# Patient Record
Sex: Male | Born: 1950 | Race: Black or African American | Hispanic: No | Marital: Married | Smoking: Never smoker
Health system: Southern US, Community
[De-identification: ages and names within clinical notes are randomized; demographics above are authoritative.]

## PROBLEM LIST (undated history)

## (undated) DIAGNOSIS — I10 Essential (primary) hypertension: Secondary | ICD-10-CM

## (undated) DIAGNOSIS — E119 Type 2 diabetes mellitus without complications: Secondary | ICD-10-CM

## (undated) DIAGNOSIS — E785 Hyperlipidemia, unspecified: Secondary | ICD-10-CM

## (undated) DIAGNOSIS — C801 Malignant (primary) neoplasm, unspecified: Secondary | ICD-10-CM

## (undated) HISTORY — DX: Essential (primary) hypertension: I10

## (undated) HISTORY — DX: Hyperlipidemia, unspecified: E78.5

## (undated) HISTORY — DX: Malignant (primary) neoplasm, unspecified: C80.1

## (undated) HISTORY — DX: Type 2 diabetes mellitus without complications: E11.9

---

## 1996-05-25 DIAGNOSIS — E119 Type 2 diabetes mellitus without complications: Secondary | ICD-10-CM

## 1996-05-25 HISTORY — DX: Type 2 diabetes mellitus without complications: E11.9

## 2004-04-29 ENCOUNTER — Inpatient Hospital Stay (HOSPITAL_COMMUNITY): Admission: EM | Admit: 2004-04-29 | Discharge: 2004-05-01 | Payer: Self-pay | Admitting: Emergency Medicine

## 2004-05-21 ENCOUNTER — Encounter: Admission: RE | Admit: 2004-05-21 | Discharge: 2004-08-19 | Payer: Self-pay | Admitting: Internal Medicine

## 2004-07-07 ENCOUNTER — Encounter: Admission: RE | Admit: 2004-07-07 | Discharge: 2004-07-07 | Payer: Self-pay | Admitting: Orthopedic Surgery

## 2006-03-31 ENCOUNTER — Inpatient Hospital Stay (HOSPITAL_COMMUNITY): Admission: RE | Admit: 2006-03-31 | Discharge: 2006-04-01 | Payer: Self-pay | Admitting: Urology

## 2006-04-14 ENCOUNTER — Emergency Department (HOSPITAL_COMMUNITY): Admission: EM | Admit: 2006-04-14 | Discharge: 2006-04-14 | Payer: Self-pay | Admitting: Emergency Medicine

## 2006-05-25 DIAGNOSIS — C801 Malignant (primary) neoplasm, unspecified: Secondary | ICD-10-CM

## 2006-05-25 HISTORY — DX: Malignant (primary) neoplasm, unspecified: C80.1

## 2006-05-25 HISTORY — PX: PROSTATE SURGERY: SHX751

## 2006-10-15 IMAGING — CR DG CHEST 1V PORT
1 series · 1 of 1 positions shown · non-contrast
Comparison: none

CLINICAL DATA: Left-sided chest pain.  Tingling into the legs.
 PORTABLE CHEST - 04/29/04 AT 0902 HOURS:
 No comparisons.   
 Allowing for the portable technique and positioning the heart size is believed to be normal.  The lungs are clear and no pleural fluid is seen.

[view not recorded]
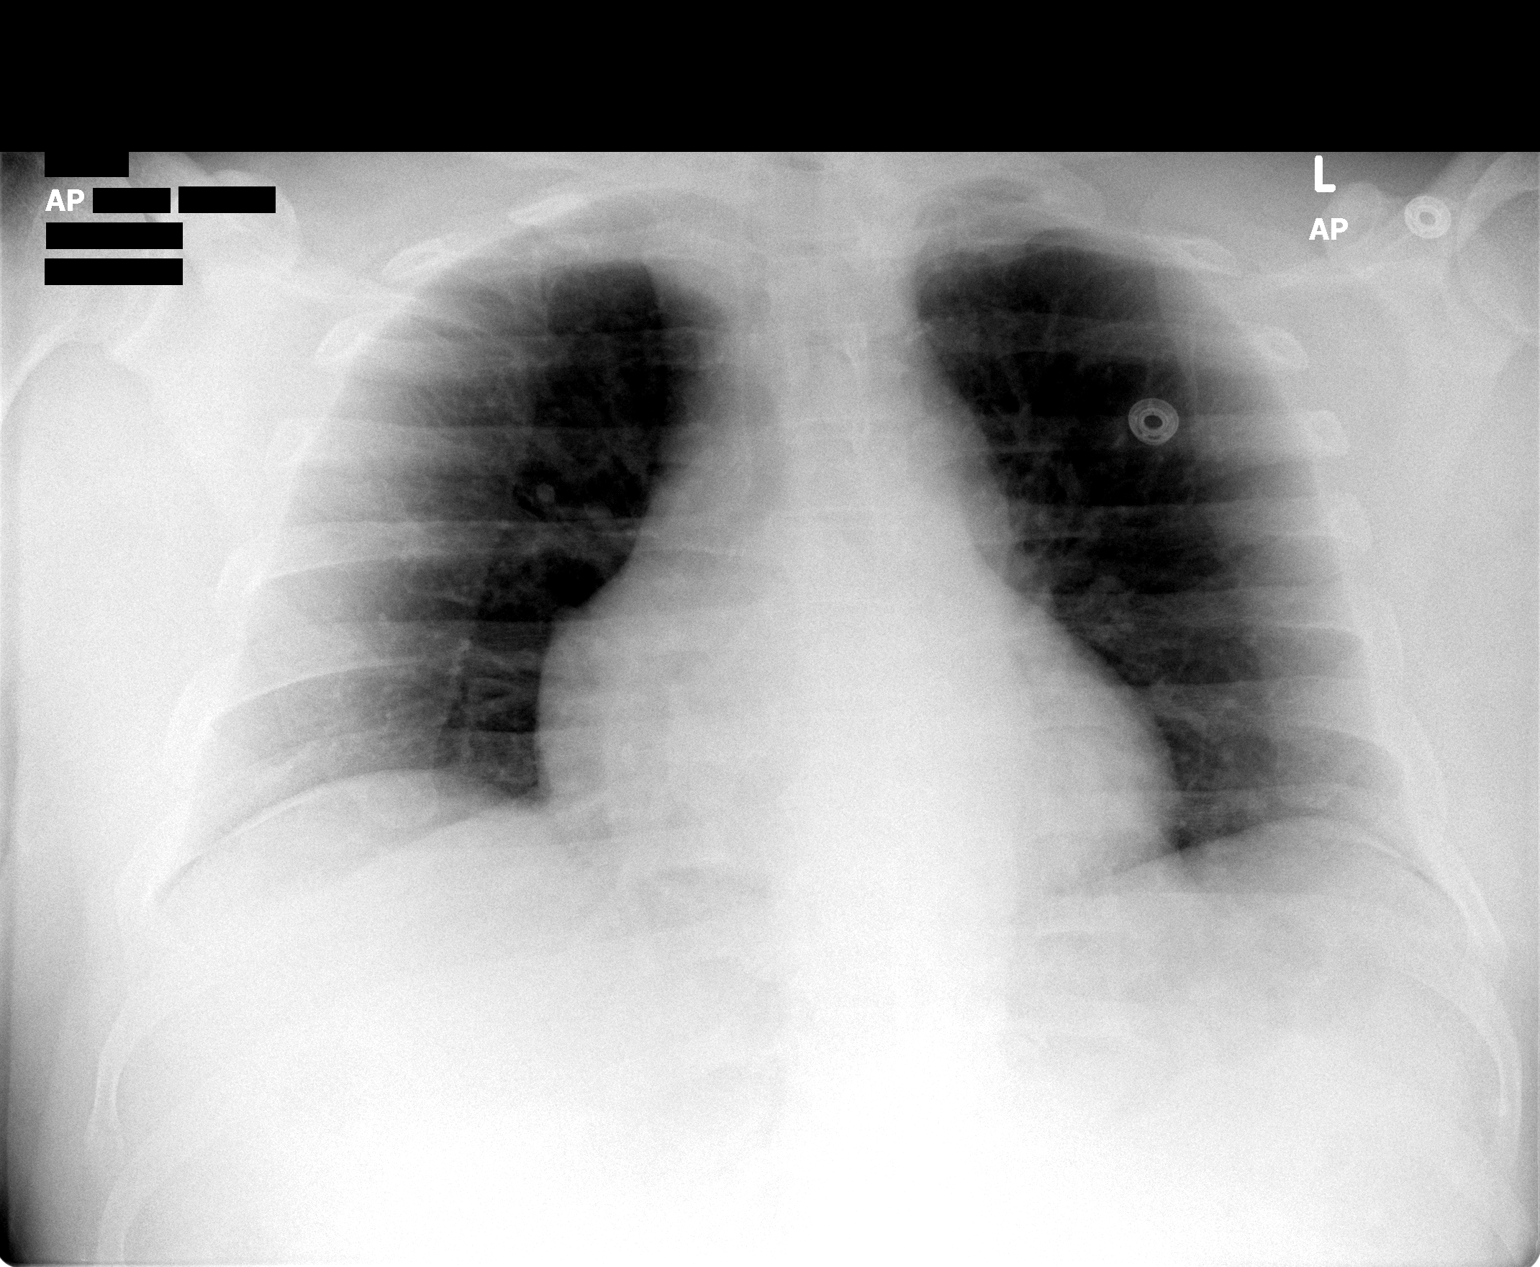

[1 of 1 positions shown; findings below may reference images not displayed]

IMPRESSION: No acute finding is seen.

## 2006-12-23 IMAGING — CT CT HEAD W/O CM
1 series · 16 of 28 positions shown, 20 images · IV contrast (agent unspecified)
Comparison: none

CLINICAL DATA: Head injury with laceration.  Contusion to head. 
 CT HEAD WITHOUT CONTRAST:
TECHNIQUE: Multidetector helical CT scanning obtained from the skull base to the vertex.

[Series 2: brain · axial · 0.49mm/px · z∈[+20,+149]mm · 16 of 28 slices shown, 20 images]
[im 2/28  brain]
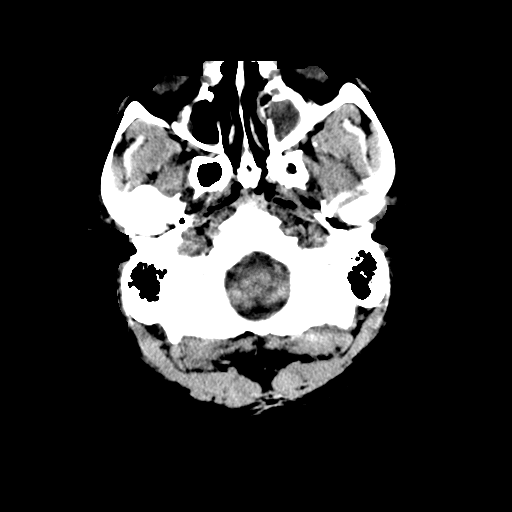
[im 2/28  bone]
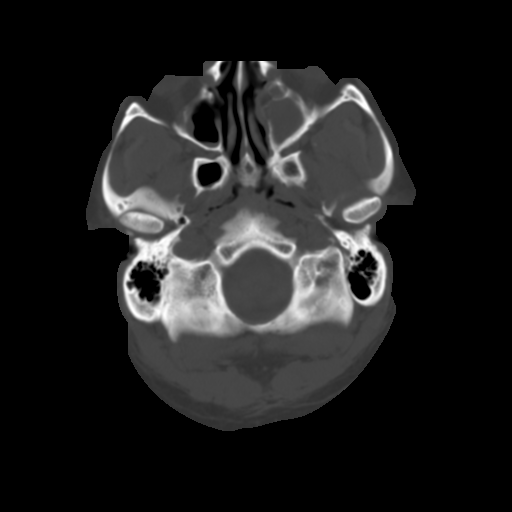
[im 4/28  brain]
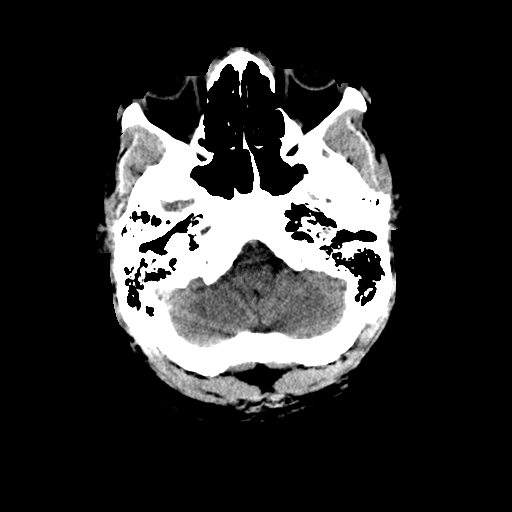
[im 6/28  brain]
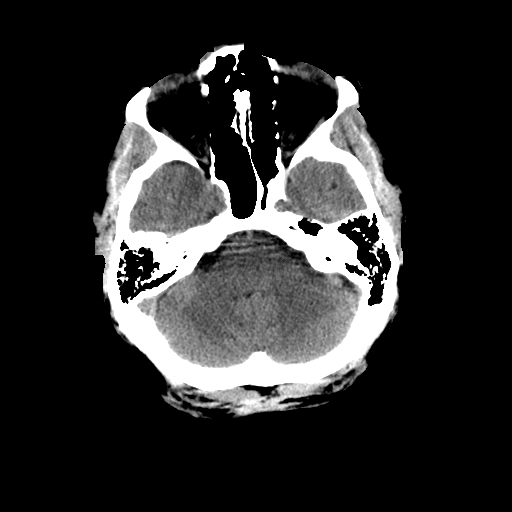
[im 7/28  brain]
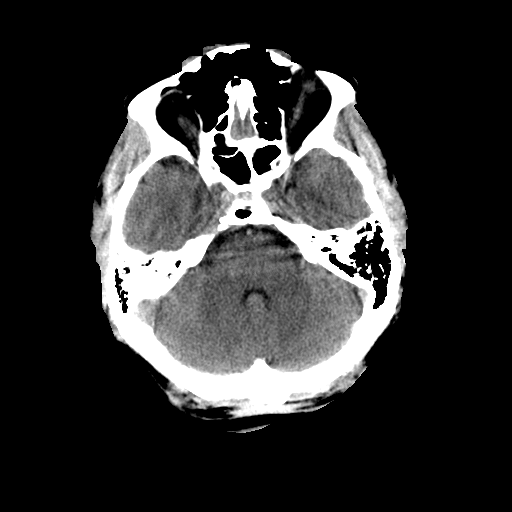
[im 9/28  brain]
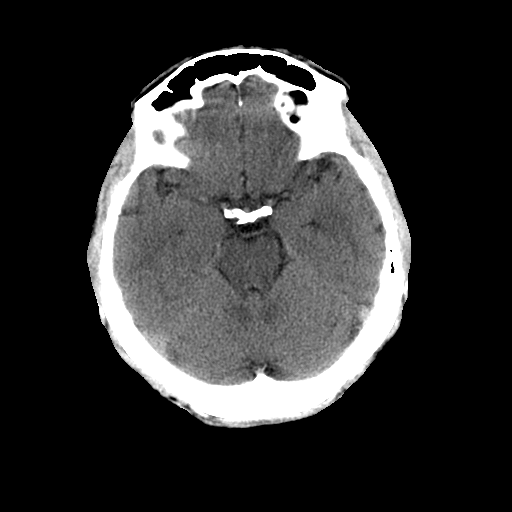
[im 9/28  bone]
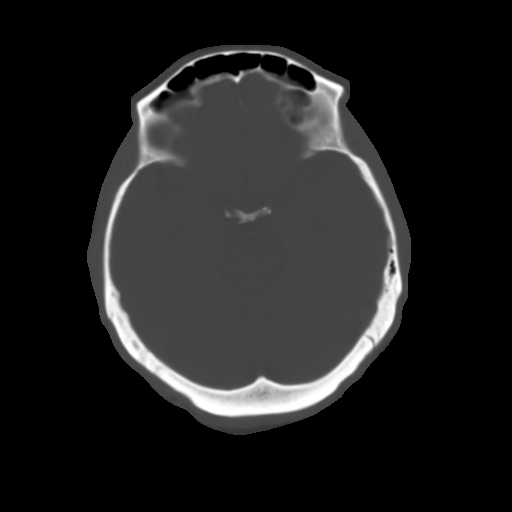
[im 10/28  brain]
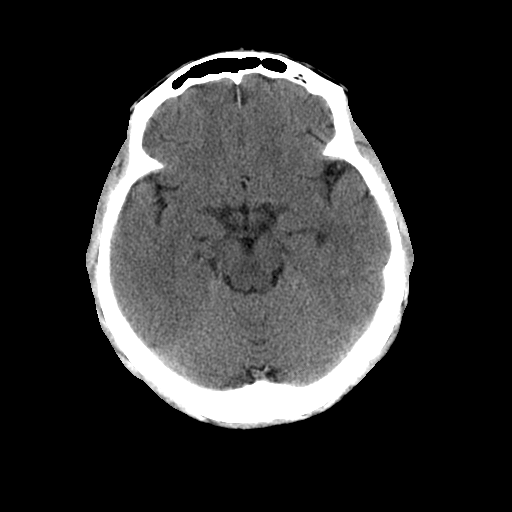
[im 12/28  brain]
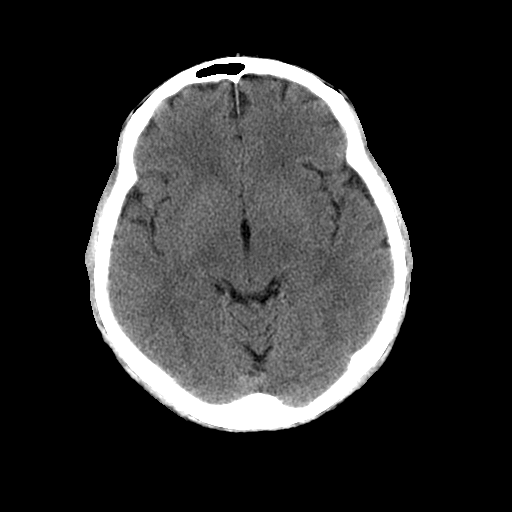
[im 14/28  brain]
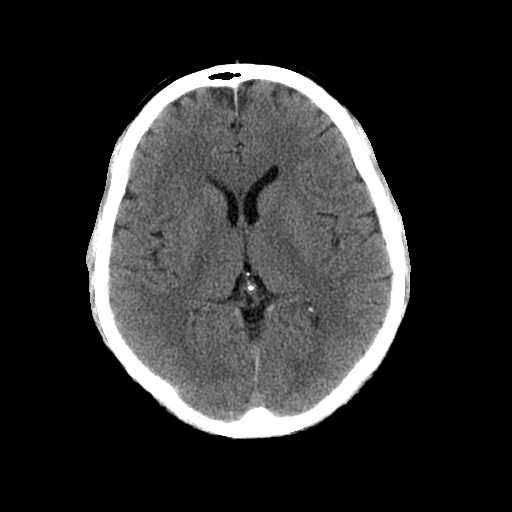
[im 15/28  brain]
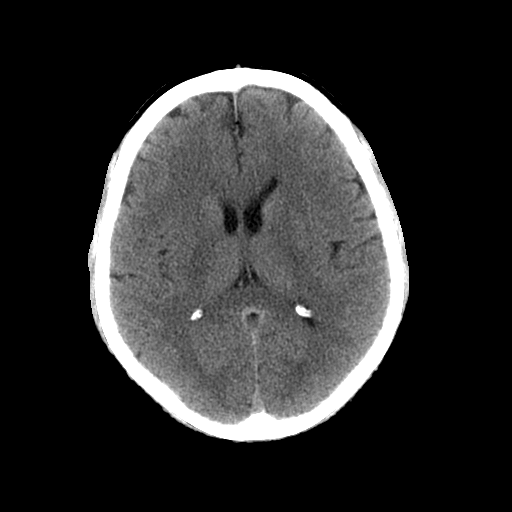
[im 15/28  bone]
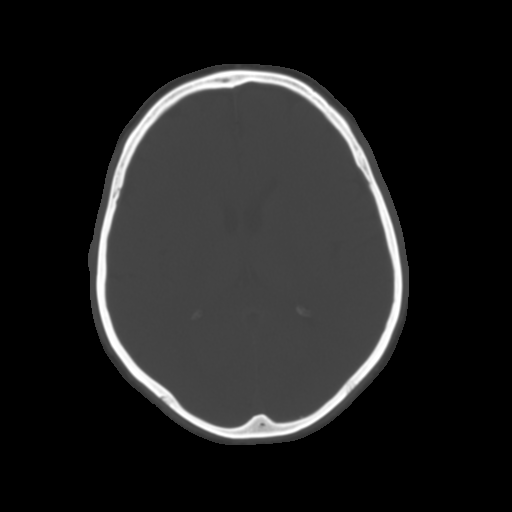
[im 17/28  brain]
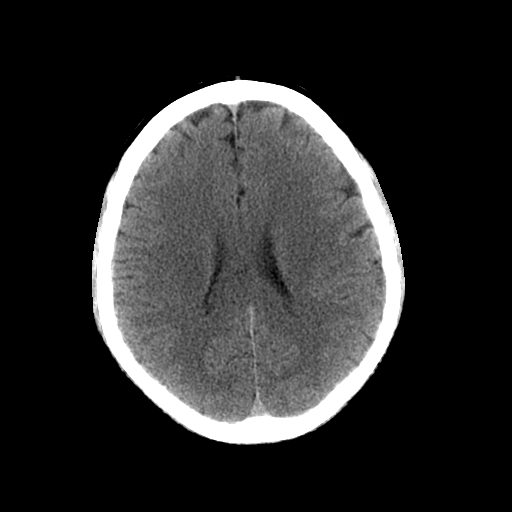
[im 19/28  brain]
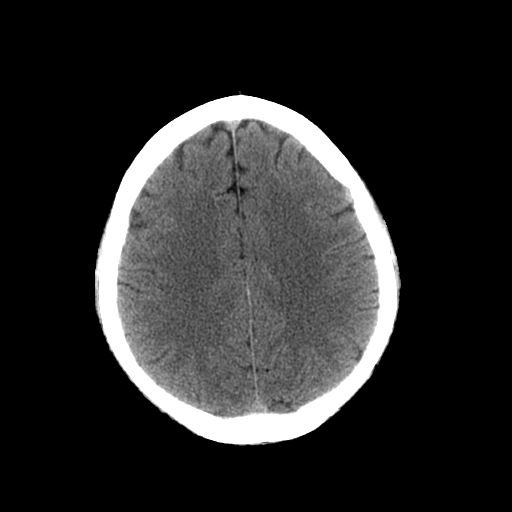
[im 20/28  brain]
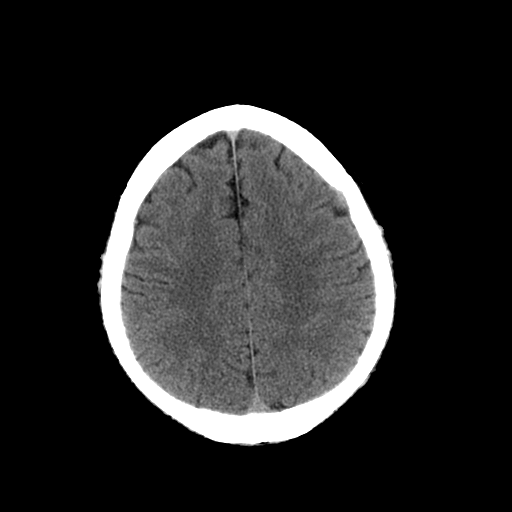
[im 22/28  brain]
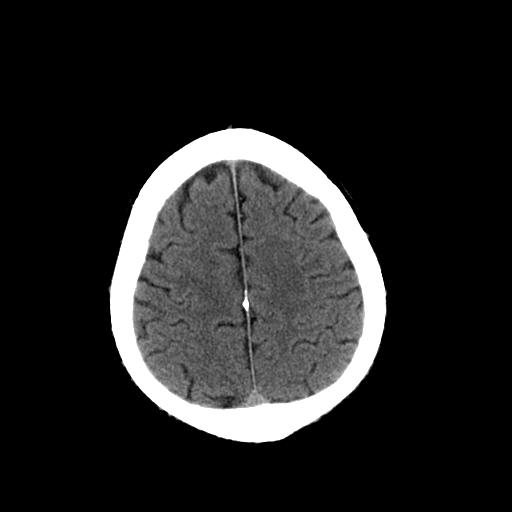
[im 22/28  bone]
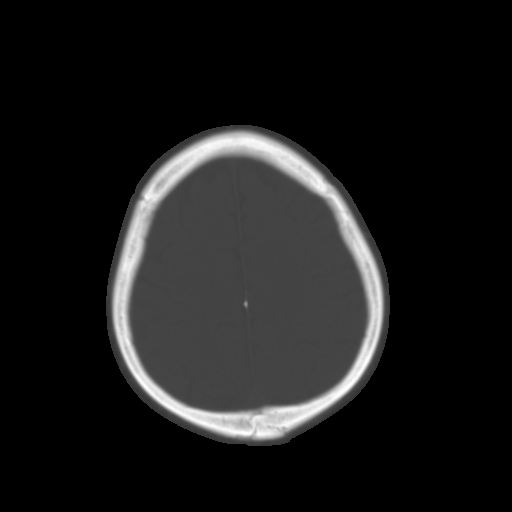
[im 23/28  brain]
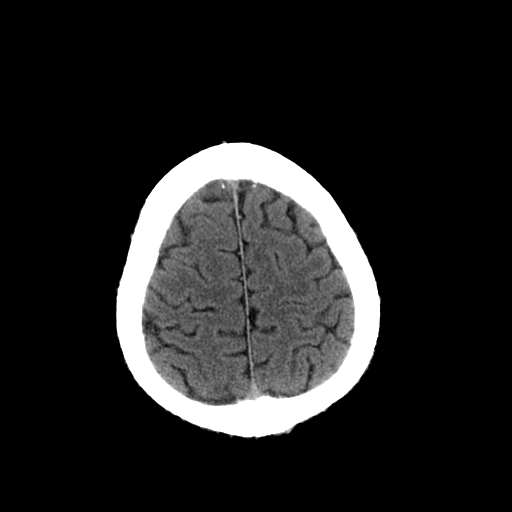
[im 25/28  brain]
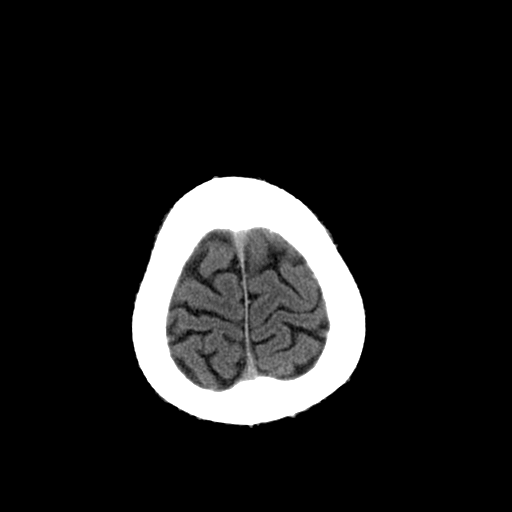
[im 27/28  brain]
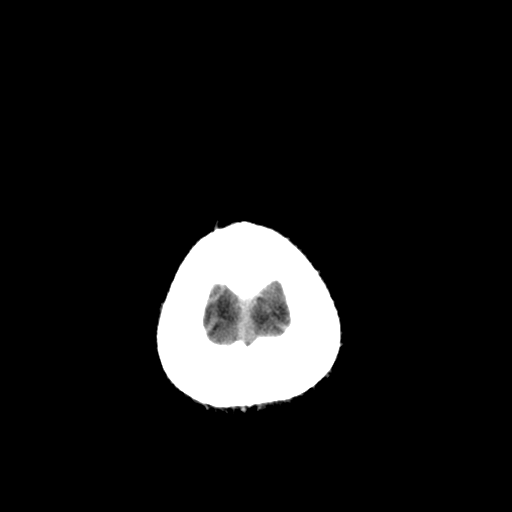

[16 of 28 positions shown; findings below may reference images not displayed]

FINDINGS: No comparison films available.  
 There is no evidence of acute intracranial abnormality including mass or mass effect, hydrocephalus, extra-axial fluid collection, midline shift, hemorrhage, or infarct.  Acute infarct may be missed by CT for 24 to 48 hours.  The visualized bony calvarium is unremarkable.  Mucosal thickening in the right maxillary sinus and near complete opacification of the left maxillary sinus likely represents chronic sinusitis.
IMPRESSION: 1.  No evidence of acute intracranial abnormality. 
 2.  Chronic maxillary sinusitis.

## 2011-01-08 ENCOUNTER — Encounter: Payer: Self-pay | Admitting: *Deleted

## 2011-01-08 ENCOUNTER — Encounter: Payer: 59 | Attending: Internal Medicine | Admitting: *Deleted

## 2011-01-08 DIAGNOSIS — Z713 Dietary counseling and surveillance: Secondary | ICD-10-CM | POA: Insufficient documentation

## 2011-01-08 DIAGNOSIS — E119 Type 2 diabetes mellitus without complications: Secondary | ICD-10-CM | POA: Insufficient documentation

## 2011-01-08 DIAGNOSIS — E785 Hyperlipidemia, unspecified: Secondary | ICD-10-CM | POA: Insufficient documentation

## 2011-01-08 NOTE — Progress Notes (Signed)
Medical Nutrition Therapy:  Appt start time:  4:00p  end time:  5:00p.  Assessment:  Primary concerns today: T2DM; Hyperlipidemia Pt with 14 h/o T2DM here for assessment and hyperlipidemia.  Works 3rd shift at [employer] and eats most meals at [employer]. Pt is currently not checking sugars. Per chart, recent H1c 7.7% and BG 128 mg/dL (07/31/63).  Lipids WNL.  Pt used to exercise frequently, but none in last 1.5 yrs d/t new marriage and moving from [place].  Excessive CHO noted in dietary recall.  See DM Self Assessment for more information.  MEDICATIONS:  Metformin (only taking 1500 of prescribed 2000 mg/day), Glimepiride, Januvia (new), Diovan-HCT, ASA 81 mg   DIETARY INTAKE:  Usual eating pattern includes 2 meals and 1-2 snacks per day.  24-hr recall: Drinks Crystal Light/Powerade (pwdr), unsweetened tea w/ sweet-n-low B (AM): [employer] - bacon and egg on flatbread, 4 potato wedges (sometimes); unsweet tea Snk (AM): None  L (PM): NONE (sleeping) Snk (PM): Fun size [employer] or granola bars (oatmeal raisan or choc chip); water D (PM): 6" sub wheat steak & cheese (decreased cheese) or tuna @ [employer]; sm bag chips (baked); unsweet tea w/ sweet-n-low  Snk (HS): Reduced fat peanut butter  Usual physical activity: NONE  Estimated energy needs: 1800-2000 calories 200-225 g carbohydrates 95-100 g protein 65-70 g fat  Progress Towards Goal(s):  NEW   Nutritional Diagnosis:  [place]-2.1 Impaired nutrient utilization related to excessive CHO intake as evidenced by patient-reported food recall and recent A1c of 7.7%.    Intervention/Long Term Goals:  Follow Diabetes Meal Plan as instructed (yellow card).  Eat 3 meals and 2 snacks, every 3-5 hrs.  Limit carbohydrate intake to 60 grams per meal and 15 grams per snack.  Add lean protein foods to all meals and snacks.  Monitor glucose levels as instructed by your doctor  Aim for 60 mins of physical activity most days - Try walking at the park on [address].   Make eye appointment as soon as possible.   Limit sodium to <2000 mg of sodium and increase fiber to 30 g daily.  Decrease meals away from home and avoid fried foods.  Monitoring/Evaluation:  Dietary intake, exercise, A1c, blood glucose, and body weight in 3 month(s).

## 2011-01-08 NOTE — Patient Instructions (Addendum)
Long Term Goals:  Follow Diabetes Meal Plan as instructed (yellow card).  Eat 3 meals and 2 snacks, every 3-5 hrs.  Limit carbohydrate intake to 60 grams per meal and 15 grams per snack.  Add lean protein foods to all meals and snacks.  Monitor glucose levels as instructed by your doctor  Aim for 60 mins of physical activity most days - Try walking at the park on [address].   Make eye appointment as soon as possible.   Limit sodium to <2000 mg of sodium and increase fiber to 30 g daily.  Decrease meals away from home and avoid fried foods.

## 2011-01-09 ENCOUNTER — Encounter: Payer: Self-pay | Admitting: *Deleted

## 2016-01-14 DIAGNOSIS — N5201 Erectile dysfunction due to arterial insufficiency: Secondary | ICD-10-CM | POA: Diagnosis not present

## 2016-01-14 DIAGNOSIS — C61 Malignant neoplasm of prostate: Secondary | ICD-10-CM | POA: Diagnosis not present

## 2016-03-11 DIAGNOSIS — Z23 Encounter for immunization: Secondary | ICD-10-CM | POA: Diagnosis not present

## 2016-03-11 DIAGNOSIS — R229 Localized swelling, mass and lump, unspecified: Secondary | ICD-10-CM | POA: Diagnosis not present

## 2016-03-11 DIAGNOSIS — I10 Essential (primary) hypertension: Secondary | ICD-10-CM | POA: Diagnosis not present

## 2016-03-11 DIAGNOSIS — R350 Frequency of micturition: Secondary | ICD-10-CM | POA: Diagnosis not present

## 2016-03-18 DIAGNOSIS — E1159 Type 2 diabetes mellitus with other circulatory complications: Secondary | ICD-10-CM | POA: Diagnosis not present

## 2016-03-18 DIAGNOSIS — E039 Hypothyroidism, unspecified: Secondary | ICD-10-CM | POA: Diagnosis not present

## 2016-03-18 DIAGNOSIS — E1165 Type 2 diabetes mellitus with hyperglycemia: Secondary | ICD-10-CM | POA: Diagnosis not present

## 2016-03-18 DIAGNOSIS — E785 Hyperlipidemia, unspecified: Secondary | ICD-10-CM | POA: Diagnosis not present

## 2016-03-18 DIAGNOSIS — E113293 Type 2 diabetes mellitus with mild nonproliferative diabetic retinopathy without macular edema, bilateral: Secondary | ICD-10-CM | POA: Diagnosis not present

## 2016-03-18 DIAGNOSIS — E1169 Type 2 diabetes mellitus with other specified complication: Secondary | ICD-10-CM | POA: Diagnosis not present

## 2016-03-18 DIAGNOSIS — I10 Essential (primary) hypertension: Secondary | ICD-10-CM | POA: Diagnosis not present

## 2017-01-11 DIAGNOSIS — M25461 Effusion, right knee: Secondary | ICD-10-CM | POA: Diagnosis not present

## 2017-01-11 DIAGNOSIS — M1612 Unilateral primary osteoarthritis, left hip: Secondary | ICD-10-CM | POA: Diagnosis not present

## 2017-01-11 DIAGNOSIS — M160 Bilateral primary osteoarthritis of hip: Secondary | ICD-10-CM | POA: Diagnosis not present

## 2017-01-11 DIAGNOSIS — M7989 Other specified soft tissue disorders: Secondary | ICD-10-CM | POA: Diagnosis not present

## 2017-01-11 DIAGNOSIS — S7011XA Contusion of right thigh, initial encounter: Secondary | ICD-10-CM | POA: Diagnosis not present

## 2017-01-11 DIAGNOSIS — I10 Essential (primary) hypertension: Secondary | ICD-10-CM | POA: Diagnosis not present

## 2017-01-11 DIAGNOSIS — M25552 Pain in left hip: Secondary | ICD-10-CM | POA: Diagnosis not present

## 2017-01-11 DIAGNOSIS — M1711 Unilateral primary osteoarthritis, right knee: Secondary | ICD-10-CM | POA: Diagnosis not present

## 2017-01-11 DIAGNOSIS — S7012XA Contusion of left thigh, initial encounter: Secondary | ICD-10-CM | POA: Diagnosis not present

## 2017-01-11 DIAGNOSIS — M25561 Pain in right knee: Secondary | ICD-10-CM | POA: Diagnosis not present

## 2017-01-26 DIAGNOSIS — M25561 Pain in right knee: Secondary | ICD-10-CM | POA: Diagnosis not present

## 2017-01-26 DIAGNOSIS — S7012XD Contusion of left thigh, subsequent encounter: Secondary | ICD-10-CM | POA: Diagnosis not present

## 2017-01-26 DIAGNOSIS — S7011XD Contusion of right thigh, subsequent encounter: Secondary | ICD-10-CM | POA: Diagnosis not present

## 2017-01-26 DIAGNOSIS — M25461 Effusion, right knee: Secondary | ICD-10-CM | POA: Diagnosis not present

## 2017-01-26 DIAGNOSIS — I10 Essential (primary) hypertension: Secondary | ICD-10-CM | POA: Diagnosis not present

## 2017-01-26 DIAGNOSIS — Z760 Encounter for issue of repeat prescription: Secondary | ICD-10-CM | POA: Diagnosis not present

## 2017-04-22 DIAGNOSIS — E1165 Type 2 diabetes mellitus with hyperglycemia: Secondary | ICD-10-CM | POA: Diagnosis not present

## 2017-04-22 DIAGNOSIS — E113293 Type 2 diabetes mellitus with mild nonproliferative diabetic retinopathy without macular edema, bilateral: Secondary | ICD-10-CM | POA: Diagnosis not present

## 2017-04-28 DIAGNOSIS — E1159 Type 2 diabetes mellitus with other circulatory complications: Secondary | ICD-10-CM | POA: Diagnosis not present

## 2017-04-28 DIAGNOSIS — E785 Hyperlipidemia, unspecified: Secondary | ICD-10-CM | POA: Diagnosis not present

## 2017-04-28 DIAGNOSIS — E1169 Type 2 diabetes mellitus with other specified complication: Secondary | ICD-10-CM | POA: Diagnosis not present

## 2017-04-28 DIAGNOSIS — E113293 Type 2 diabetes mellitus with mild nonproliferative diabetic retinopathy without macular edema, bilateral: Secondary | ICD-10-CM | POA: Diagnosis not present

## 2017-04-28 DIAGNOSIS — E1165 Type 2 diabetes mellitus with hyperglycemia: Secondary | ICD-10-CM | POA: Diagnosis not present

## 2017-04-28 DIAGNOSIS — E039 Hypothyroidism, unspecified: Secondary | ICD-10-CM | POA: Diagnosis not present

## 2017-04-28 DIAGNOSIS — I10 Essential (primary) hypertension: Secondary | ICD-10-CM | POA: Diagnosis not present

## 2017-05-05 DIAGNOSIS — Z23 Encounter for immunization: Secondary | ICD-10-CM | POA: Diagnosis not present

## 2017-06-09 DIAGNOSIS — E039 Hypothyroidism, unspecified: Secondary | ICD-10-CM | POA: Diagnosis not present

## 2017-08-11 DIAGNOSIS — E113293 Type 2 diabetes mellitus with mild nonproliferative diabetic retinopathy without macular edema, bilateral: Secondary | ICD-10-CM | POA: Diagnosis not present

## 2017-08-11 DIAGNOSIS — E1159 Type 2 diabetes mellitus with other circulatory complications: Secondary | ICD-10-CM | POA: Diagnosis not present

## 2017-08-11 DIAGNOSIS — E1165 Type 2 diabetes mellitus with hyperglycemia: Secondary | ICD-10-CM | POA: Diagnosis not present

## 2017-08-11 DIAGNOSIS — E785 Hyperlipidemia, unspecified: Secondary | ICD-10-CM | POA: Diagnosis not present

## 2017-08-11 DIAGNOSIS — E039 Hypothyroidism, unspecified: Secondary | ICD-10-CM | POA: Diagnosis not present

## 2017-08-11 DIAGNOSIS — I10 Essential (primary) hypertension: Secondary | ICD-10-CM | POA: Diagnosis not present

## 2017-08-11 DIAGNOSIS — E1169 Type 2 diabetes mellitus with other specified complication: Secondary | ICD-10-CM | POA: Diagnosis not present

## 2017-11-18 DIAGNOSIS — E039 Hypothyroidism, unspecified: Secondary | ICD-10-CM | POA: Diagnosis not present

## 2017-11-18 DIAGNOSIS — E1169 Type 2 diabetes mellitus with other specified complication: Secondary | ICD-10-CM | POA: Diagnosis not present

## 2017-11-18 DIAGNOSIS — E1165 Type 2 diabetes mellitus with hyperglycemia: Secondary | ICD-10-CM | POA: Diagnosis not present

## 2017-11-18 DIAGNOSIS — E113293 Type 2 diabetes mellitus with mild nonproliferative diabetic retinopathy without macular edema, bilateral: Secondary | ICD-10-CM | POA: Diagnosis not present

## 2017-11-18 DIAGNOSIS — E785 Hyperlipidemia, unspecified: Secondary | ICD-10-CM | POA: Diagnosis not present

## 2017-11-18 DIAGNOSIS — E1159 Type 2 diabetes mellitus with other circulatory complications: Secondary | ICD-10-CM | POA: Diagnosis not present

## 2017-11-18 DIAGNOSIS — I10 Essential (primary) hypertension: Secondary | ICD-10-CM | POA: Diagnosis not present

## 2018-04-17 DIAGNOSIS — Z23 Encounter for immunization: Secondary | ICD-10-CM | POA: Diagnosis not present

## 2018-05-23 DIAGNOSIS — E1169 Type 2 diabetes mellitus with other specified complication: Secondary | ICD-10-CM | POA: Diagnosis not present

## 2018-05-23 DIAGNOSIS — E1165 Type 2 diabetes mellitus with hyperglycemia: Secondary | ICD-10-CM | POA: Diagnosis not present

## 2018-05-23 DIAGNOSIS — E113293 Type 2 diabetes mellitus with mild nonproliferative diabetic retinopathy without macular edema, bilateral: Secondary | ICD-10-CM | POA: Diagnosis not present

## 2018-05-23 DIAGNOSIS — E785 Hyperlipidemia, unspecified: Secondary | ICD-10-CM | POA: Diagnosis not present

## 2018-05-23 DIAGNOSIS — E039 Hypothyroidism, unspecified: Secondary | ICD-10-CM | POA: Diagnosis not present

## 2019-02-25 ENCOUNTER — Ambulatory Visit (HOSPITAL_COMMUNITY)
Admission: EM | Admit: 2019-02-25 | Discharge: 2019-02-25 | Disposition: A | Discharge status: Home / Self Care | Payer: Medicare Other | Attending: Family Medicine | Admitting: Family Medicine

## 2019-02-25 ENCOUNTER — Encounter (HOSPITAL_COMMUNITY): Payer: Self-pay

## 2019-02-25 ENCOUNTER — Other Ambulatory Visit: Payer: Self-pay

## 2019-02-25 DIAGNOSIS — N481 Balanitis: Secondary | ICD-10-CM

## 2019-02-25 MED ORDER — MICONAZOLE NITRATE 2 % EX CREA: 1.0000 "application " | TOPICAL_CREAM | Freq: Two times a day (BID) | CUTANEOUS | 0 refills | Status: DC

## 2019-02-25 NOTE — ED Provider Notes (Signed)
[facility]    CSN: [id number] Arrival date & time: 02/25/19  1304      History   Chief Complaint Chief Complaint  Patient presents with  . penis irritation    HPI Brian Marks is a 68 y.o. male patient complains of some penile irritation of the glans.  There is no drainage or discharge.  There is some inflammation of the glands as well as localized swelling.  He has used Neosporin prior to visit today.  He is a diabetic.Marland Kitchen   HPI  Past Medical History:  Diagnosis Date  . Cancer [facility]) 2008   Prostate  . Hypertension   . Other and unspecified hyperlipidemia   . Type II or unspecified type diabetes mellitus without mention of complication, not stated as uncontrolled 1998    There are no active problems to display for this patient.   Past Surgical History:  Procedure Laterality Date  . PROSTATE SURGERY  2008       Home Medications    Prior to Admission medications   Medication Sig Start Date End Date Taking? Authorizing Provider  aspirin 81 MG tablet Take 81 mg by mouth daily.      [provider]  atorvastatin (LIPITOR) 10 MG tablet Take 10 mg by mouth daily.      [provider]  diclofenac (VOLTAREN) 25 MG EC tablet Take 25 mg by mouth as needed.      [provider]  glimepiride (AMARYL) 4 MG tablet Take 4 mg by mouth daily.      [provider]  metFORMIN (GLUCOPHAGE-XR) 500 MG 24 hr tablet Take 2,000 mg by mouth daily with breakfast. Per pt, only taking 1500 per day.     [provider]  miconazole (MICOTIN) 2 % cream Apply 1 application topically 2 (two) times daily. 02/25/19   Wardell Honour, MD  sitaGLIPtin (JANUVIA) 100 MG tablet Take 100 mg by mouth daily.      [provider]  tadalafil (CIALIS) 20 MG tablet Take 20 mg by mouth daily as needed.      [provider]  valsartan-hydrochlorothiazide (DIOVAN-HCT) 160-25 MG per tablet Take 1 tablet by mouth daily.      [provider]    Family History Family History  Problem Relation Age of Onset  . Kidney disease Mother   . Hyperlipidemia Other   . Hypertension Other     Social History Social History   Tobacco Use  . Smoking status: Never Smoker  . Smokeless tobacco: Never Used  Substance Use Topics  . Alcohol use: No  . Drug use: No     Allergies   Patient has no known allergies.   Review of Systems Review of Systems  Genitourinary: Positive for penile swelling.  All other systems reviewed and are negative.    Physical Exam Triage Vital Signs ED Triage Vitals  Enc Vitals Group     BP 02/25/19 1404 130/80     Pulse --      Resp 02/25/19 1404 18     Temp 02/25/19 1404 98.1 F (36.7 C)     Temp Source 02/25/19 1404 Oral     SpO2 02/25/19 1404 98 %     Weight 02/25/19 1402 252 lb (114.3 kg)     Height --      Head Circumference --      Peak Flow --      Pain Score 02/25/19 1402 5  Pain Loc --      Pain Edu? --      Excl. in [place]? --    No data found.  Updated Vital Signs BP 130/80 (BP Location: Right Arm)   Temp 98.1 F (36.7 C) (Oral)   Resp 18   Wt 114.3 kg   SpO2 98%   BMI 32.80 kg/m   Visual Acuity Right Eye Distance:   Left Eye Distance:   Bilateral Distance:    Right Eye Near:   Left Eye Near:    Bilateral Near:     Physical Exam Nursing note reviewed.  Constitutional:      Appearance: Normal appearance.  Genitourinary:    Comments: There is some inflammation and localized swelling of the foreskin.  There is no drainage or discharge. Neurological:     Mental Status: He is alert.      UC Treatments / Results  Labs (all labs ordered are listed, but only abnormal results are displayed) Labs Reviewed - No data to display  EKG   Radiology No results found.  Procedures Procedures (including critical care time)  Medications Ordered in UC Medications - No data to display  Initial Impression / Assessment and Plan / UC Course  I  have reviewed the triage vital signs and the nursing notes.  Pertinent labs & imaging results that were available during my care of the patient were reviewed by me and considered in my medical decision making (see chart for details).     Balanitis.  Will treat with miconazole.  Of also instructed in hygiene and keeping area dry as much as possible Final Clinical Impressions(s) / UC Diagnoses   Final diagnoses:  Balanitis   Discharge Instructions   None    ED Prescriptions    Medication Sig Dispense Auth. Provider   miconazole (MICOTIN) 2 % cream Apply 1 application topically 2 (two) times daily. 28.35 g Wardell Honour, MD     PDMP not reviewed this encounter.   Wardell Honour, MD 02/25/19 1430

## 2019-02-25 NOTE — ED Triage Notes (Signed)
Pt states he has been burning pt states he has a skin abrasion.  X 5 days

## 2019-07-25 DIAGNOSIS — E1165 Type 2 diabetes mellitus with hyperglycemia: Secondary | ICD-10-CM | POA: Diagnosis not present

## 2019-07-25 DIAGNOSIS — E039 Hypothyroidism, unspecified: Secondary | ICD-10-CM | POA: Diagnosis not present

## 2019-07-25 DIAGNOSIS — E113293 Type 2 diabetes mellitus with mild nonproliferative diabetic retinopathy without macular edema, bilateral: Secondary | ICD-10-CM | POA: Diagnosis not present

## 2019-07-31 DIAGNOSIS — E1159 Type 2 diabetes mellitus with other circulatory complications: Secondary | ICD-10-CM | POA: Diagnosis not present

## 2019-07-31 DIAGNOSIS — I10 Essential (primary) hypertension: Secondary | ICD-10-CM | POA: Diagnosis not present

## 2019-07-31 DIAGNOSIS — E1165 Type 2 diabetes mellitus with hyperglycemia: Secondary | ICD-10-CM | POA: Diagnosis not present

## 2019-07-31 DIAGNOSIS — E785 Hyperlipidemia, unspecified: Secondary | ICD-10-CM | POA: Diagnosis not present

## 2019-07-31 DIAGNOSIS — E113293 Type 2 diabetes mellitus with mild nonproliferative diabetic retinopathy without macular edema, bilateral: Secondary | ICD-10-CM | POA: Diagnosis not present

## 2019-07-31 DIAGNOSIS — E039 Hypothyroidism, unspecified: Secondary | ICD-10-CM | POA: Diagnosis not present

## 2019-07-31 DIAGNOSIS — E1169 Type 2 diabetes mellitus with other specified complication: Secondary | ICD-10-CM | POA: Diagnosis not present

## 2019-09-12 DIAGNOSIS — E1169 Type 2 diabetes mellitus with other specified complication: Secondary | ICD-10-CM | POA: Diagnosis not present

## 2019-09-12 DIAGNOSIS — E785 Hyperlipidemia, unspecified: Secondary | ICD-10-CM | POA: Diagnosis not present

## 2019-09-12 DIAGNOSIS — E039 Hypothyroidism, unspecified: Secondary | ICD-10-CM | POA: Diagnosis not present

## 2019-09-12 DIAGNOSIS — I10 Essential (primary) hypertension: Secondary | ICD-10-CM | POA: Diagnosis not present

## 2019-09-12 DIAGNOSIS — E113293 Type 2 diabetes mellitus with mild nonproliferative diabetic retinopathy without macular edema, bilateral: Secondary | ICD-10-CM | POA: Diagnosis not present

## 2019-09-12 DIAGNOSIS — E1159 Type 2 diabetes mellitus with other circulatory complications: Secondary | ICD-10-CM | POA: Diagnosis not present

## 2019-09-12 DIAGNOSIS — E1165 Type 2 diabetes mellitus with hyperglycemia: Secondary | ICD-10-CM | POA: Diagnosis not present

## 2019-11-30 ENCOUNTER — Other Ambulatory Visit: Payer: Self-pay

## 2019-11-30 ENCOUNTER — Ambulatory Visit (HOSPITAL_COMMUNITY)
Admission: EM | Admit: 2019-11-30 | Discharge: 2019-11-30 | Disposition: A | Discharge status: Home / Self Care | Payer: Medicare Other

## 2019-11-30 ENCOUNTER — Encounter (HOSPITAL_COMMUNITY): Payer: Self-pay

## 2019-11-30 DIAGNOSIS — N481 Balanitis: Secondary | ICD-10-CM | POA: Diagnosis not present

## 2019-11-30 DIAGNOSIS — K5900 Constipation, unspecified: Secondary | ICD-10-CM | POA: Diagnosis not present

## 2019-11-30 DIAGNOSIS — K6289 Other specified diseases of anus and rectum: Secondary | ICD-10-CM

## 2019-11-30 MED ORDER — NYSTATIN 100000 UNIT/GM EX CREA: TOPICAL_CREAM | CUTANEOUS | 0 refills | Status: DC

## 2019-11-30 MED ORDER — POLYETHYLENE GLYCOL 3350 17 GM/SCOOP PO POWD: 17.0000 g | Freq: Every day | ORAL | 0 refills | Status: DC

## 2019-11-30 MED ORDER — LIDOCAINE HCL URETHRAL/MUCOSAL 2 % EX GEL: CUTANEOUS | 0 refills | Status: DC | PRN

## 2019-11-30 NOTE — ED Provider Notes (Signed)
[place]    CSN: [id number] Arrival date & time: 11/30/19  1359      History   Chief Complaint Chief Complaint  Patient presents with  . Constipation    HPI Brian Marks is a 69 y.o. male.   Patient presents for 2 weeks of constipation.  He reports he is moving his bowels however he is straining.  He reports some rectal pain and blood on the paper at times.  Denies dark stool.  He reports he had a bowel movement yesterday however is just strenuous to have and somewhat painful.  Denies abdominal pain.  No fevers or chills.  He reports he is in the process of trying establish a primary care.   Reports he has some irritation under his foreskin.  He reports he had a similar issue last year where he received treatment.  He has been trying similar treatments but this has not been improving.     Past Medical History:  Diagnosis Date  . Cancer [facility]) 2008   Prostate  . Hypertension   . Other and unspecified hyperlipidemia   . Type II or unspecified type diabetes mellitus without mention of complication, not stated as uncontrolled 1998    There are no problems to display for this patient.   Past Surgical History:  Procedure Laterality Date  . PROSTATE SURGERY  2008       Home Medications    Prior to Admission medications   Medication Sig Start Date End Date Taking? Authorizing Provider  hydrochlorothiazide (HYDRODIURIL) 25 MG tablet Take 25 mg by mouth daily.   Yes [provider]  aspirin 81 MG tablet Take 81 mg by mouth daily.      [provider]  atorvastatin (LIPITOR) 10 MG tablet Take 10 mg by mouth daily.      [provider]  diclofenac (VOLTAREN) 25 MG EC tablet Take 25 mg by mouth as needed.      [provider]  glimepiride (AMARYL) 4 MG tablet Take 4 mg by mouth daily.      [provider]  JANUMET XR 50-1000 MG TB24 Take 1 tablet by mouth at bedtime. 11/09/19   [provider]  lidocaine  (XYLOCAINE) 2 % jelly Apply topically as needed. 11/30/19   Uriah Trueba, Marguerita Beards, PA-C  metFORMIN (GLUCOPHAGE-XR) 500 MG 24 hr tablet Take 2,000 mg by mouth daily with breakfast. Per pt, only taking 1500 per day.     [provider]  miconazole (MICOTIN) 2 % cream Apply 1 application topically 2 (two) times daily. 02/25/19   Wardell Honour, MD  nystatin cream (MYCOSTATIN) Apply to affected area 2 times daily 11/30/19   Inetta Dicke, Marguerita Beards, PA-C  polyethylene glycol powder (GLYCOLAX/MIRALAX) 17 GM/SCOOP powder Take 17 g by mouth daily. 11/30/19   Sender Rueb, Marguerita Beards, PA-C  sitaGLIPtin (JANUVIA) 100 MG tablet Take 100 mg by mouth daily.      [provider]  tadalafil (CIALIS) 20 MG tablet Take 20 mg by mouth daily as needed.      [provider]  valsartan-hydrochlorothiazide (DIOVAN-HCT) 160-25 MG per tablet Take 1 tablet by mouth daily.      [provider]    Family History Family History  Problem Relation Age of Onset  . Kidney disease Mother   . Hyperlipidemia Other   . Hypertension Other     Social History Social History   Tobacco Use  . Smoking status: Never Smoker  . Smokeless  tobacco: Never Used  Substance Use Topics  . Alcohol use: No  . Drug use: No     Allergies   Patient has no known allergies.   Review of Systems Review of Systems   Physical Exam Triage Vital Signs ED Triage Vitals  Enc Vitals Group     BP 11/30/19 1538 139/79     Pulse Rate 11/30/19 1538 77     Resp 11/30/19 1538 16     Temp 11/30/19 1538 98.9 F (37.2 C)     Temp Source 11/30/19 1538 Oral     SpO2 11/30/19 1538 98 %     Weight 11/30/19 1539 260 lb (117.9 kg)     Height 11/30/19 1539 6\' 2"  (1.88 m)     Head Circumference --      Peak Flow --      Pain Score 11/30/19 1539 5     Pain Loc --      Pain Edu? --      Excl. in [place]? --    No data found.  Updated Vital Signs BP 139/79   Pulse 77   Temp 98.9 F (37.2 C) (Oral)   Resp 16   Ht 6\' 2"  (1.88 m)   Wt 260 lb  (117.9 kg)   SpO2 98%   BMI 33.38 kg/m   Visual Acuity Right Eye Distance:   Left Eye Distance:   Bilateral Distance:    Right Eye Near:   Left Eye Near:    Bilateral Near:     Physical Exam Vitals and nursing note reviewed.  Constitutional:      Appearance: He is well-developed. He is not ill-appearing.  HENT:     Head: Normocephalic and atraumatic.  Eyes:     Conjunctiva/sclera: Conjunctivae normal.  Cardiovascular:     Rate and Rhythm: Normal rate and regular rhythm.     Heart sounds: No murmur heard.   Pulmonary:     Effort: Pulmonary effort is normal. No respiratory distress.     Breath sounds: Normal breath sounds.  Abdominal:     Palpations: Abdomen is soft.     Tenderness: There is no abdominal tenderness.  Genitourinary:    Penis: Uncircumcised.        Comments: No external hemorrhoid.  No obvious anal fissure   There is a area of irritation and erythema dorsal aspect of the proximal glands when foreskin retracted.  No gland swelling of foreskin.  Foreskin easily retracts and suspected in place.  No urethral discharge. Musculoskeletal:     Cervical back: Neck supple.  Skin:    General: Skin is warm and dry.  Neurological:     Mental Status: He is alert.      UC Treatments / Results  Labs (all labs ordered are listed, but only abnormal results are displayed) Labs Reviewed - No data to display  EKG   Radiology No results found.  Procedures Procedures (including critical care time)  Medications Ordered in UC Medications - No data to display  Initial Impression / Assessment and Plan / UC Course  I have reviewed the triage vital signs and the nursing notes.  Pertinent labs & imaging results that were available during my care of the patient were reviewed by me and considered in my medical decision making (see chart for details).     #Constipation #Rectal pain #Balanitis Patient is a 69 year old presenting with recent history of  constipation with secondary rectal pain from straining, as well as mild balanitis .  Exam reassuring without obvious fissure or external hemorrhoid.  We will treat with MiraLAX daily as well as brief use of lidocaine jelly for symptom relief.  The patient had previous episodes of balanitis thought to be fungal in the past, will treat with the nystatin cream today.  Recommended follow-up with primary care in about 1 week.  Return and fall precautions were discussed.  Patient verbalized understanding plan of care. Final Clinical Impressions(s) / UC Diagnoses   Final diagnoses:  Constipation, unspecified constipation type  Rectal pain  Balanitis     Discharge Instructions     Use miralax daily in a class of water  Apply the lidocine jelly to rectum as needed for rectal pain  Apply the cream to the area on your genital  Return if not improving in 1 week,  Schedule follow up with your primary care, I have also given another option      ED Prescriptions    Medication Sig Dispense Auth. Provider   polyethylene glycol powder (GLYCOLAX/MIRALAX) 17 GM/SCOOP powder Take 17 g by mouth daily. 507 g Jahlil Ziller, Marguerita Beards, PA-C   nystatin cream (MYCOSTATIN) Apply to affected area 2 times daily 30 g Maleeha Halls, Marguerita Beards, PA-C   lidocaine (XYLOCAINE) 2 % jelly Apply topically as needed. 30 mL Josalynn Johndrow, Marguerita Beards, PA-C     PDMP not reviewed this encounter.   Purnell Shoemaker, PA-C 11/30/19 2351

## 2019-11-30 NOTE — Discharge Instructions (Signed)
Use miralax daily in a class of water  Apply the lidocine jelly to rectum as needed for rectal pain  Apply the cream to the area on your genital  Return if not improving in 1 week,  Schedule follow up with your primary care, I have also given another option

## 2019-11-30 NOTE — ED Triage Notes (Signed)
Pt c/o constipationx2 wks. Pt states he has had hard stools daily.

## 2019-12-18 DIAGNOSIS — K625 Hemorrhage of anus and rectum: Secondary | ICD-10-CM | POA: Diagnosis not present

## 2019-12-18 DIAGNOSIS — R194 Change in bowel habit: Secondary | ICD-10-CM | POA: Diagnosis not present

## 2019-12-18 DIAGNOSIS — K5900 Constipation, unspecified: Secondary | ICD-10-CM | POA: Diagnosis not present

## 2019-12-26 DIAGNOSIS — D120 Benign neoplasm of cecum: Secondary | ICD-10-CM | POA: Diagnosis not present

## 2019-12-26 DIAGNOSIS — K6389 Other specified diseases of intestine: Secondary | ICD-10-CM | POA: Diagnosis not present

## 2019-12-26 DIAGNOSIS — K635 Polyp of colon: Secondary | ICD-10-CM | POA: Diagnosis not present

## 2019-12-26 DIAGNOSIS — K625 Hemorrhage of anus and rectum: Secondary | ICD-10-CM | POA: Diagnosis not present

## 2019-12-26 DIAGNOSIS — R194 Change in bowel habit: Secondary | ICD-10-CM | POA: Diagnosis not present

## 2020-04-16 DIAGNOSIS — E1169 Type 2 diabetes mellitus with other specified complication: Secondary | ICD-10-CM | POA: Diagnosis not present

## 2020-04-16 DIAGNOSIS — E113293 Type 2 diabetes mellitus with mild nonproliferative diabetic retinopathy without macular edema, bilateral: Secondary | ICD-10-CM | POA: Diagnosis not present

## 2020-04-16 DIAGNOSIS — E669 Obesity, unspecified: Secondary | ICD-10-CM | POA: Insufficient documentation

## 2020-04-16 DIAGNOSIS — E1159 Type 2 diabetes mellitus with other circulatory complications: Secondary | ICD-10-CM | POA: Diagnosis not present

## 2020-04-16 DIAGNOSIS — E039 Hypothyroidism, unspecified: Secondary | ICD-10-CM | POA: Diagnosis not present

## 2020-07-16 DIAGNOSIS — E785 Hyperlipidemia, unspecified: Secondary | ICD-10-CM | POA: Diagnosis not present

## 2020-07-16 DIAGNOSIS — E039 Hypothyroidism, unspecified: Secondary | ICD-10-CM | POA: Diagnosis not present

## 2020-07-16 DIAGNOSIS — E1122 Type 2 diabetes mellitus with diabetic chronic kidney disease: Secondary | ICD-10-CM | POA: Diagnosis not present

## 2020-07-16 DIAGNOSIS — E1169 Type 2 diabetes mellitus with other specified complication: Secondary | ICD-10-CM | POA: Diagnosis not present

## 2020-07-16 DIAGNOSIS — E113293 Type 2 diabetes mellitus with mild nonproliferative diabetic retinopathy without macular edema, bilateral: Secondary | ICD-10-CM | POA: Diagnosis not present

## 2020-07-16 DIAGNOSIS — N182 Chronic kidney disease, stage 2 (mild): Secondary | ICD-10-CM | POA: Diagnosis not present

## 2020-07-16 DIAGNOSIS — E1165 Type 2 diabetes mellitus with hyperglycemia: Secondary | ICD-10-CM | POA: Diagnosis not present

## 2020-07-16 LAB — HM DIABETES EYE EXAM

## 2020-07-19 DIAGNOSIS — E1169 Type 2 diabetes mellitus with other specified complication: Secondary | ICD-10-CM | POA: Diagnosis not present

## 2020-07-19 DIAGNOSIS — E039 Hypothyroidism, unspecified: Secondary | ICD-10-CM | POA: Diagnosis not present

## 2020-07-19 DIAGNOSIS — E1165 Type 2 diabetes mellitus with hyperglycemia: Secondary | ICD-10-CM | POA: Diagnosis not present

## 2020-07-19 DIAGNOSIS — E113293 Type 2 diabetes mellitus with mild nonproliferative diabetic retinopathy without macular edema, bilateral: Secondary | ICD-10-CM | POA: Diagnosis not present

## 2020-07-19 DIAGNOSIS — E785 Hyperlipidemia, unspecified: Secondary | ICD-10-CM | POA: Diagnosis not present

## 2021-02-16 ENCOUNTER — Encounter (HOSPITAL_COMMUNITY): Payer: Self-pay | Admitting: Emergency Medicine

## 2021-02-16 ENCOUNTER — Ambulatory Visit (HOSPITAL_COMMUNITY)
Admission: EM | Admit: 2021-02-16 | Discharge: 2021-02-16 | Disposition: A | Discharge status: Home / Self Care | Payer: Medicare Other | Attending: Student | Admitting: Student

## 2021-02-16 ENCOUNTER — Other Ambulatory Visit: Payer: Self-pay

## 2021-02-16 DIAGNOSIS — E1159 Type 2 diabetes mellitus with other circulatory complications: Secondary | ICD-10-CM

## 2021-02-16 DIAGNOSIS — I10 Essential (primary) hypertension: Secondary | ICD-10-CM | POA: Diagnosis not present

## 2021-02-16 DIAGNOSIS — Z7984 Long term (current) use of oral hypoglycemic drugs: Secondary | ICD-10-CM | POA: Diagnosis not present

## 2021-02-16 DIAGNOSIS — Z760 Encounter for issue of repeat prescription: Secondary | ICD-10-CM | POA: Diagnosis not present

## 2021-02-16 DIAGNOSIS — E1169 Type 2 diabetes mellitus with other specified complication: Secondary | ICD-10-CM

## 2021-02-16 MED ORDER — LOSARTAN POTASSIUM 50 MG PO TABS: 50.0000 mg | ORAL_TABLET | Freq: Every day | ORAL | 0 refills | Status: DC

## 2021-02-16 MED ORDER — GLIMEPIRIDE 2 MG PO TABS: 2.0000 mg | ORAL_TABLET | Freq: Two times a day (BID) | ORAL | 0 refills | Status: DC

## 2021-02-16 MED ORDER — JANUMET 50-1000 MG PO TABS: 1.0000 | ORAL_TABLET | Freq: Every day | ORAL | 0 refills | Status: DC

## 2021-02-16 MED ORDER — PIOGLITAZONE HCL 15 MG PO TABS: 15.0000 mg | ORAL_TABLET | Freq: Every day | ORAL | 0 refills | Status: DC

## 2021-02-16 NOTE — ED Provider Notes (Signed)
[place]    CSN: [id number] Arrival date & time: 02/16/21  1221      History   Chief Complaint Chief Complaint  Patient presents with   Medication Refill    HPI Brian Marks is a 70 y.o. male presenting for medication refill.  Medical history hypertension, diabetes.  States that he was previously followed by a primary care, he is trying to switch his primary care and so was not able to get refills from them.  Feeling well today.  He has been out of his meds for a couple of days.  Blood pressure is elevated today, he attributes this to life stressors.  Denies headaches, vision changes, chest pain, shortness of breath, dizziness.  HPI  Past Medical History:  Diagnosis Date   Cancer ([place]) 2008   Prostate   Hypertension    Other and unspecified hyperlipidemia    Type II or unspecified type diabetes mellitus without mention of complication, not stated as uncontrolled 1998    There are no problems to display for this patient.   Past Surgical History:  Procedure Laterality Date   PROSTATE SURGERY  2008       Home Medications    Prior to Admission medications   Medication Sig Start Date End Date Taking? Authorizing Provider  losartan (COZAAR) 50 MG tablet Take 1 tablet (50 mg total) by mouth daily. 02/16/21  Yes Hazel Sams, PA-C  pioglitazone (ACTOS) 15 MG tablet Take 1 tablet (15 mg total) by mouth daily. 02/16/21  Yes Hazel Sams, PA-C  sitaGLIPtin-metformin (JANUMET) 50-1000 MG tablet Take 1 tablet by mouth daily. 02/16/21 05/17/21 Yes Hazel Sams, PA-C  aspirin 81 MG tablet Take 81 mg by mouth daily.      [provider]  atorvastatin (LIPITOR) 10 MG tablet Take 10 mg by mouth daily.      [provider]  diclofenac (VOLTAREN) 25 MG EC tablet Take 25 mg by mouth as needed.      [provider]  glimepiride (AMARYL) 2 MG tablet Take 1 tablet (2 mg total) by mouth 2 (two) times daily. 02/16/21 05/17/21  Hazel Sams,  PA-C  hydrochlorothiazide (HYDRODIURIL) 25 MG tablet Take 25 mg by mouth daily.    [provider]  lidocaine (XYLOCAINE) 2 % jelly Apply topically as needed. 11/30/19   Darr, Edison Nasuti, PA-C  metFORMIN (GLUCOPHAGE-XR) 500 MG 24 hr tablet Take 2,000 mg by mouth daily with breakfast. Per pt, only taking 1500 per day.     [provider]  miconazole (MICOTIN) 2 % cream Apply 1 application topically 2 (two) times daily. 02/25/19   Wardell Honour, MD  nystatin cream (MYCOSTATIN) Apply to affected area 2 times daily 11/30/19   Darr, Edison Nasuti, PA-C  polyethylene glycol powder (GLYCOLAX/MIRALAX) 17 GM/SCOOP powder Take 17 g by mouth daily. 11/30/19   Darr, Edison Nasuti, PA-C  sitaGLIPtin (JANUVIA) 100 MG tablet Take 100 mg by mouth daily.      [provider]  tadalafil (CIALIS) 20 MG tablet Take 20 mg by mouth daily as needed.      [provider]  valsartan-hydrochlorothiazide (DIOVAN-HCT) 160-25 MG per tablet Take 1 tablet by mouth daily.      [provider]    Family History Family History  Problem Relation Age of Onset   Kidney disease Mother    Hyperlipidemia Other    Hypertension Other     Social History Social History   Tobacco Use  Smoking status: Never   Smokeless tobacco: Never  Substance Use Topics   Alcohol use: No   Drug use: No     Allergies   Patient has no known allergies.   Review of Systems Review of Systems  Constitutional:  Negative for appetite change, chills and fever.  HENT:  Negative for congestion, ear pain, rhinorrhea, sinus pressure, sinus pain and sore throat.   Eyes:  Negative for redness and visual disturbance.  Respiratory:  Negative for cough, chest tightness, shortness of breath and wheezing.   Cardiovascular:  Negative for chest pain and palpitations.  Gastrointestinal:  Negative for abdominal pain, constipation, diarrhea, nausea and vomiting.  Genitourinary:  Negative for dysuria, frequency and urgency.   Musculoskeletal:  Negative for myalgias.  Neurological:  Negative for dizziness, weakness and headaches.  Psychiatric/Behavioral:  Negative for confusion.   All other systems reviewed and are negative.   Physical Exam Triage Vital Signs ED Triage Vitals  Enc Vitals Group     BP 02/16/21 1259 (!) 188/89     Pulse Rate 02/16/21 1259 76     Resp 02/16/21 1259 18     Temp 02/16/21 1259 98.7 F (37.1 C)     Temp Source 02/16/21 1259 Oral     SpO2 02/16/21 1259 96 %     Weight --      Height --      Head Circumference --      Peak Flow --      Pain Score 02/16/21 1258 0     Pain Loc --      Pain Edu? --      Excl. in [place]? --    No data found.  Updated Vital Signs BP (!) 188/89 (BP Location: Left Arm) Comment: took last HTn medication this morning  Pulse 76   Temp 98.7 F (37.1 C) (Oral)   Resp 18   SpO2 96%   Visual Acuity Right Eye Distance:   Left Eye Distance:   Bilateral Distance:    Right Eye Near:   Left Eye Near:    Bilateral Near:     Physical Exam Vitals reviewed.  Constitutional:      Appearance: Normal appearance. He is not diaphoretic.  HENT:     Head: Normocephalic and atraumatic.     Mouth/Throat:     Mouth: Mucous membranes are moist.  Eyes:     Extraocular Movements: Extraocular movements intact.     Pupils: Pupils are equal, round, and reactive to light.  Cardiovascular:     Rate and Rhythm: Normal rate and regular rhythm.     Pulses:          Radial pulses are 2+ on the right side and 2+ on the left side.     Heart sounds: Normal heart sounds.  Pulmonary:     Effort: Pulmonary effort is normal.     Breath sounds: Normal breath sounds.  Abdominal:     Palpations: Abdomen is soft.     Tenderness: There is no abdominal tenderness. There is no guarding or rebound.  Musculoskeletal:     Right lower leg: No edema.     Left lower leg: No edema.  Skin:    General: Skin is warm.     Capillary Refill: Capillary refill takes less than 2  seconds.  Neurological:     General: No focal deficit present.     Mental Status: He is alert and oriented to person, place, and time.  Psychiatric:  Mood and Affect: Mood normal.        Behavior: Behavior normal.        Thought Content: Thought content normal.        Judgment: Judgment normal.     UC Treatments / Results  Labs (all labs ordered are listed, but only abnormal results are displayed) Labs Reviewed - No data to display  EKG   Radiology No results found.  Procedures Procedures (including critical care time)  Medications Ordered in UC Medications - No data to display  Initial Impression / Assessment and Plan / UC Course  I have reviewed the triage vital signs and the nursing notes.  Pertinent labs & imaging results that were available during my care of the patient were reviewed by me and considered in my medical decision making (see chart for details).     This patient is a very pleasant 70 y.o. year old male presenting with medication refills- out of 4 medications since stopping seeing his PCP. New PCP referral sent.   Resent pioglitazone, losartan, janumet, glimepiride. Patient has been on medications >1 year without issue so sent 90 day scripts.  ED return precautions discussed. Patient verbalizes understanding and agreement.   Coding Level 4 for acute exacerbation chronic conditions, and prescription drug management  Final Clinical Impressions(s) / UC Diagnoses   Final diagnoses:  Medication refill  Essential hypertension  Type 2 diabetes mellitus with other specified complication, without long-term current use of insulin [facility])     Discharge Instructions      -Restart medications -Please check your blood pressure at home or at the pharmacy. If this continues to be >140/90, follow-up with your primary care provider for further blood pressure management/ medication titration. If you develop chest pain, shortness of breath, vision changes,  the worst headache of your life- head straight to the ED or call 911.    ED Prescriptions     Medication Sig Dispense Auth. Provider   pioglitazone (ACTOS) 15 MG tablet Take 1 tablet (15 mg total) by mouth daily. 90 tablet Hazel Sams, PA-C   losartan (COZAAR) 50 MG tablet Take 1 tablet (50 mg total) by mouth daily. 90 tablet Hazel Sams, PA-C   sitaGLIPtin-metformin (JANUMET) 50-1000 MG tablet Take 1 tablet by mouth daily. 90 tablet Hazel Sams, PA-C   glimepiride (AMARYL) 2 MG tablet Take 1 tablet (2 mg total) by mouth 2 (two) times daily. 180 tablet Hazel Sams, PA-C      PDMP not reviewed this encounter.   Hazel Sams, PA-C 02/16/21 1322

## 2021-02-16 NOTE — Discharge Instructions (Addendum)
-  Restart medications -Please check your blood pressure at home or at the pharmacy. If this continues to be >140/90, follow-up with your primary care provider for further blood pressure management/ medication titration. If you develop chest pain, shortness of breath, vision changes, the worst headache of your life- head straight to the ED or call 911.

## 2021-02-16 NOTE — ED Triage Notes (Signed)
Pt reports not going to PCP anymore that was seeing and having to find a new one Needing medications refilled: Pioglitazone 15mg  daily Losartan 50 mg daily Janumet 50-1000mg  daily Glimepiride 25mg  twice a day with meals

## 2021-02-20 ENCOUNTER — Telehealth (HOSPITAL_COMMUNITY): Payer: Self-pay | Admitting: Student

## 2021-02-20 MED ORDER — SITAGLIP PHOS-METFORMIN HCL ER 50-1000 MG PO TB24: 1.0000 | ORAL_TABLET | Freq: Every day | ORAL | 0 refills | Status: DC

## 2021-02-20 NOTE — Telephone Encounter (Signed)
Janumet resent in ER formulation

## 2021-05-14 ENCOUNTER — Telehealth (HOSPITAL_COMMUNITY): Payer: Self-pay | Admitting: Family Medicine

## 2021-05-14 ENCOUNTER — Encounter (HOSPITAL_COMMUNITY): Payer: Self-pay | Admitting: Emergency Medicine

## 2021-05-14 ENCOUNTER — Other Ambulatory Visit: Payer: Self-pay

## 2021-05-14 ENCOUNTER — Ambulatory Visit (HOSPITAL_COMMUNITY)
Admission: EM | Admit: 2021-05-14 | Discharge: 2021-05-14 | Disposition: A | Discharge status: Home / Self Care | Payer: Medicare Other | Attending: Family Medicine | Admitting: Family Medicine

## 2021-05-14 DIAGNOSIS — E1159 Type 2 diabetes mellitus with other circulatory complications: Secondary | ICD-10-CM | POA: Diagnosis not present

## 2021-05-14 DIAGNOSIS — I129 Hypertensive chronic kidney disease with stage 1 through stage 4 chronic kidney disease, or unspecified chronic kidney disease: Secondary | ICD-10-CM | POA: Insufficient documentation

## 2021-05-14 DIAGNOSIS — E1122 Type 2 diabetes mellitus with diabetic chronic kidney disease: Secondary | ICD-10-CM | POA: Diagnosis not present

## 2021-05-14 DIAGNOSIS — E039 Hypothyroidism, unspecified: Secondary | ICD-10-CM | POA: Insufficient documentation

## 2021-05-14 DIAGNOSIS — E1165 Type 2 diabetes mellitus with hyperglycemia: Secondary | ICD-10-CM | POA: Insufficient documentation

## 2021-05-14 DIAGNOSIS — E1169 Type 2 diabetes mellitus with other specified complication: Secondary | ICD-10-CM

## 2021-05-14 DIAGNOSIS — N183 Chronic kidney disease, stage 3 unspecified: Secondary | ICD-10-CM | POA: Insufficient documentation

## 2021-05-14 DIAGNOSIS — N1831 Chronic kidney disease, stage 3a: Secondary | ICD-10-CM | POA: Diagnosis not present

## 2021-05-14 DIAGNOSIS — E0590 Thyrotoxicosis, unspecified without thyrotoxic crisis or storm: Secondary | ICD-10-CM

## 2021-05-14 LAB — COMPREHENSIVE METABOLIC PANEL
ALT: 24 U/L (ref 0–44)
AST: 25 U/L (ref 15–41)
Albumin: 3.8 g/dL (ref 3.5–5.0)
Alkaline Phosphatase: 73 U/L (ref 38–126)
Anion gap: 9 (ref 5–15)
BUN: 15 mg/dL (ref 8–23)
CO2: 29 mmol/L (ref 22–32)
Calcium: 9.7 mg/dL (ref 8.9–10.3)
Chloride: 95 mmol/L — ABNORMAL LOW (ref 98–111)
Creatinine, Ser: 1.48 mg/dL — ABNORMAL HIGH (ref 0.61–1.24)
GFR, Estimated: 51 mL/min — ABNORMAL LOW (ref >60)
Glucose, Bld: 396 mg/dL — ABNORMAL HIGH (ref 70–99)
Potassium: 4.1 mmol/L (ref 3.5–5.1)
Sodium: 133 mmol/L — ABNORMAL LOW (ref 135–145)
Total Bilirubin: 1.1 mg/dL (ref 0.3–1.2)
Total Protein: 7.5 g/dL (ref 6.5–8.1)

## 2021-05-14 LAB — POCT URINALYSIS DIPSTICK, ED / UC
Bilirubin Urine: NEGATIVE
Glucose, UA: >=1000 mg/dL — AB
Hgb urine dipstick: NEGATIVE
Ketones, ur: NEGATIVE mg/dL
Leukocytes,Ua: NEGATIVE
Nitrite: NEGATIVE
Protein, ur: NEGATIVE mg/dL
Specific Gravity, Urine: 1.01 (ref 1.005–1.030)
Urobilinogen, UA: 0.2 mg/dL (ref 0.0–1.0)
pH: 5.5 (ref 5.0–8.0)

## 2021-05-14 LAB — HEMOGLOBIN A1C
Hgb A1c MFr Bld: 13.2 % — ABNORMAL HIGH (ref 4.8–5.6)
Mean Plasma Glucose: 332.14 mg/dL

## 2021-05-14 LAB — CBG MONITORING, ED: Glucose-Capillary: 391 mg/dL — ABNORMAL HIGH (ref 70–99)

## 2021-05-14 LAB — TSH: TSH: 36.986 u[IU]/mL — ABNORMAL HIGH (ref 0.350–4.500)

## 2021-05-14 MED ORDER — ACCU-CHEK SOFTCLIX LANCETS MISC: 0 refills | Status: DC

## 2021-05-14 MED ORDER — ACCU-CHEK AVIVA PLUS VI STRP: ORAL_STRIP | 0 refills | Status: DC

## 2021-05-14 MED ORDER — LEVOTHYROXINE SODIUM 125 MCG PO TABS: 125.0000 ug | ORAL_TABLET | Freq: Every day | ORAL | 0 refills | Status: DC

## 2021-05-14 MED ORDER — GLIMEPIRIDE 2 MG PO TABS: 2.0000 mg | ORAL_TABLET | Freq: Two times a day (BID) | ORAL | 0 refills | Status: DC

## 2021-05-14 MED ORDER — HYDROCHLOROTHIAZIDE 25 MG PO TABS
25.0000 mg | ORAL_TABLET | Freq: Every day | ORAL | 0 refills | Status: DC
Start: 2021-05-14 — End: 2021-06-12

## 2021-05-14 MED ORDER — PIOGLITAZONE HCL 15 MG PO TABS: 15.0000 mg | ORAL_TABLET | Freq: Every day | ORAL | 0 refills | Status: DC

## 2021-05-14 MED ORDER — LOSARTAN POTASSIUM 50 MG PO TABS: 50.0000 mg | ORAL_TABLET | Freq: Every day | ORAL | 0 refills | Status: DC

## 2021-05-14 MED ORDER — SITAGLIP PHOS-METFORMIN HCL ER 50-1000 MG PO TB24: 1.0000 | ORAL_TABLET | Freq: Every day | ORAL | 0 refills | Status: DC

## 2021-05-14 NOTE — ED Provider Notes (Signed)
[place]    CSN: [id number] Arrival date & time: 05/14/21  1322      History   Chief Complaint Chief Complaint  Patient presents with   Medication Refill    HPI Brian Marks is a 70 y.o. male.   HPI Brian Marks presents today for medication refill.  Patient has a medical history consisting of hypertension, hyperlipidemia, hypothyroidism, type II uncontrolled diabetes, CKD 3.  Patient has been lost to follow-up with her primary care provider since February 2022.  He was seen here at urgent care for medication refills in September and is back today as he has ran out of most of his medications. He has not established with a PCP. He is not taking his thyroid medication and inconsistently taking diabetes and antihypertensive medications. He doesn't check his blood sugar at home. Last A1C 10 months ago 10.3. Glucose on arrival 391-patient declined insulin.  Past Medical History:  Diagnosis Date   Cancer ([place]) 2008   Prostate   Hypertension    Other and unspecified hyperlipidemia    Type II or unspecified type diabetes mellitus without mention of complication, not stated as uncontrolled 1998    There are no problems to display for this patient.   Past Surgical History:  Procedure Laterality Date   PROSTATE SURGERY  2008       Home Medications    Prior to Admission medications   Medication Sig Start Date End Date Taking? Authorizing Provider  glimepiride (AMARYL) 2 MG tablet Take 1 tablet (2 mg total) by mouth 2 (two) times daily. 02/16/21 05/17/21 Yes Hazel Sams, PA-C  hydrochlorothiazide (HYDRODIURIL) 25 MG tablet Take 25 mg by mouth daily.   Yes [provider]  levothyroxine (SYNTHROID) 125 MCG tablet Take 125 mcg by mouth daily before breakfast.   Yes [provider]  losartan (COZAAR) 50 MG tablet Take 50 mg by mouth daily.   Yes [provider]  pioglitazone (ACTOS) 15 MG tablet Take 1 tablet (15 mg total) by mouth daily.  02/16/21  Yes Hazel Sams, PA-C  aspirin 81 MG tablet Take 81 mg by mouth daily.      [provider]  atorvastatin (LIPITOR) 10 MG tablet Take 10 mg by mouth daily.      [provider]  diclofenac (VOLTAREN) 25 MG EC tablet Take 25 mg by mouth as needed.   Patient not taking: Reported on 05/14/2021    [provider]  lidocaine (XYLOCAINE) 2 % jelly Apply topically as needed. 11/30/19   Darr, Edison Nasuti, PA-C  losartan (COZAAR) 50 MG tablet Take 1 tablet (50 mg total) by mouth daily. 02/16/21   Hazel Sams, PA-C  metFORMIN (GLUCOPHAGE-XR) 500 MG 24 hr tablet Take 2,000 mg by mouth daily with breakfast. Per pt, only taking 1500 per day.     [provider]  miconazole (MICOTIN) 2 % cream Apply 1 application topically 2 (two) times daily. 02/25/19   Wardell Honour, MD  nystatin cream (MYCOSTATIN) Apply to affected area 2 times daily 11/30/19   Darr, Edison Nasuti, PA-C  polyethylene glycol powder (GLYCOLAX/MIRALAX) 17 GM/SCOOP powder Take 17 g by mouth daily. 11/30/19   Darr, Edison Nasuti, PA-C  sitaGLIPtin (JANUVIA) 100 MG tablet Take 100 mg by mouth daily.   Patient not taking: Reported on 05/14/2021    [provider]  SitaGLIPtin-MetFORMIN HCl 50-1000 MG TB24 Take 1 tablet by mouth daily. Patient not taking: Reported on 05/14/2021 02/20/21   Brian Roberts  E, PA-C  tadalafil (CIALIS) 20 MG tablet Take 20 mg by mouth daily as needed.      [provider]  valsartan-hydrochlorothiazide (DIOVAN-HCT) 160-25 MG per tablet Take 1 tablet by mouth daily.      [provider]    Family History Family History  Problem Relation Age of Onset   Kidney disease Mother    Hyperlipidemia Other    Hypertension Other     Social History Social History   Tobacco Use   Smoking status: Never   Smokeless tobacco: Never  Vaping Use   Vaping Use: Never used  Substance Use Topics   Alcohol use: No   Drug use: No     Allergies   Patient has no known  allergies.   Review of Systems Review of Systems Pertinent negatives listed in HPI  Physical Exam Triage Vital Signs ED Triage Vitals  Enc Vitals Group     BP 05/14/21 1439 (!) 155/94     Pulse Rate 05/14/21 1439 85     Resp 05/14/21 1439 18     Temp 05/14/21 1439 98.3 F (36.8 C)     Temp Source 05/14/21 1439 Oral     SpO2 05/14/21 1439 96 %     Weight --      Height --      Head Circumference --      Peak Flow --      Pain Score 05/14/21 1433 0     Pain Loc --      Pain Edu? --      Excl. in [place]? --    No data found.  Updated Vital Signs BP (!) 155/94 (BP Location: Left Arm)    Pulse 85    Temp 98.3 F (36.8 C) (Oral)    Resp 18    SpO2 96%   Visual Acuity Right Eye Distance:   Left Eye Distance:   Bilateral Distance:    Right Eye Near:   Left Eye Near:    Bilateral Near:     Physical Exam Constitutional: Patient alert, cooperative, non-distressed HENT: Normocephalic, atraumatic, External right and left ear normal.   Eyes: Conjunctivae and EOM are normal. PERRLA, no scleral icterus. Neck: Normal ROM. Neck supple.  CVS: RRR, S1/S2 +, no murmurs, no gallops Pulmonary: Effort and breath sounds normal, no stridor, rhonchi, wheezes, rales.  Musculoskeletal: Normal range of motion. No edema  Neuro: No obvious focal neurological abnormalities Skin: Skin is warm and dry.  Psychiatric: Normal mood and affect.  UC Treatments / Results  Labs (all labs ordered are listed, but only abnormal results are displayed) Labs Reviewed  HEMOGLOBIN A1C - Abnormal; Notable for the following components:      Result Value   Hgb A1c MFr Bld 13.2 (*)    All other components within normal limits  COMPREHENSIVE METABOLIC PANEL - Abnormal; Notable for the following components:   Sodium 133 (*)    Chloride 95 (*)    Glucose, Bld 396 (*)    Creatinine, Ser 1.48 (*)    GFR, Estimated 51 (*)    All other components within normal limits  TSH - Abnormal; Notable for the following  components:   TSH 36.986 (*)    All other components within normal limits  POCT URINALYSIS DIPSTICK, ED / UC - Abnormal; Notable for the following components:   Glucose, UA >=1000 (*)    All other components within normal limits  CBG MONITORING, ED - Abnormal; Notable for the  following components:   Glucose-Capillary 391 (*)    All other components within normal limits    EKG   Radiology No results found.  Procedures Procedures (including critical care time)  Medications Ordered in UC Medications - No data to display  Initial Impression / Assessment and Plan / UC Course  I have reviewed the triage vital signs and the nursing notes.  Pertinent labs & imaging results that were available during my care of the patient were reviewed by me and considered in my medical decision making (see chart for details).   Uncontrolled diabetes with hyperglycemia today with glucose 391. Patient has diabetes medication in medicine bottles today and admits to not taking medication. Counseled at length of the adverse health consequences  Associated with poorly controlled diabetes and hypertension. Will await lab results prior to refilling medications. I will call patient once lab results within the next few hours. Patient was scheduled for new patient appointment prior to ending his visit today. Patient verbalized understanding and agreement with plan. ER if any symptoms worsen Final Clinical Impressions(s) / UC Diagnoses   Final diagnoses:  Type 2 diabetes mellitus with stage 3a chronic kidney disease, without long-term current use of insulin (H[facility])  Type 2 DM with CKD stage 3 and hypertension ([place])  Uncontrolled type 2 diabetes mellitus with hyperglycemia ([place])  Hypothyroidism, unspecified type     Discharge Instructions      Once your labs result I will refill medications as appropriate based on your lab function. It is imperative that we get you established an seen by primary care. Please  be sure to make your primary care appointment scheduled for 06/12/2021. If for any reason you are unable to make the appointment please call and reschedule.    ED Prescriptions   None    PDMP not reviewed this encounter.   Scot Jun, FNP 05/15/21 [phone]

## 2021-05-14 NOTE — Telephone Encounter (Addendum)
Called patient to notify him of abnormal lab work and refilled all medications. Patient set up and scheduled for a new PCP appointment next month. Refilled glucose monitoring strips and lancets.

## 2021-05-14 NOTE — ED Triage Notes (Signed)
Completely out of pioglitazone and levothyroxin

## 2021-05-14 NOTE — Discharge Instructions (Addendum)
Once your labs result I will refill medications as appropriate based on your lab function. It is imperative that we get you established an seen by primary care. Please be sure to make your primary care appointment scheduled for 06/12/2021. If for any reason you are unable to make the appointment please call and reschedule.

## 2021-05-14 NOTE — ED Triage Notes (Signed)
Medication refills

## 2021-06-12 ENCOUNTER — Encounter: Payer: Self-pay | Admitting: Family

## 2021-06-12 ENCOUNTER — Ambulatory Visit (INDEPENDENT_AMBULATORY_CARE_PROVIDER_SITE_OTHER): Payer: Medicare Other | Admitting: Family

## 2021-06-12 ENCOUNTER — Other Ambulatory Visit: Payer: Self-pay

## 2021-06-12 VITALS — BP 157/83 | HR 78 | Temp 97.2°F | Ht 74.0 in | Wt 263.6 lb

## 2021-06-12 DIAGNOSIS — I152 Hypertension secondary to endocrine disorders: Secondary | ICD-10-CM | POA: Diagnosis not present

## 2021-06-12 DIAGNOSIS — E785 Hyperlipidemia, unspecified: Secondary | ICD-10-CM

## 2021-06-12 DIAGNOSIS — E1122 Type 2 diabetes mellitus with diabetic chronic kidney disease: Secondary | ICD-10-CM | POA: Diagnosis not present

## 2021-06-12 DIAGNOSIS — E1169 Type 2 diabetes mellitus with other specified complication: Secondary | ICD-10-CM | POA: Insufficient documentation

## 2021-06-12 DIAGNOSIS — N182 Chronic kidney disease, stage 2 (mild): Secondary | ICD-10-CM

## 2021-06-12 DIAGNOSIS — E1159 Type 2 diabetes mellitus with other circulatory complications: Secondary | ICD-10-CM | POA: Diagnosis not present

## 2021-06-12 DIAGNOSIS — E039 Hypothyroidism, unspecified: Secondary | ICD-10-CM | POA: Diagnosis not present

## 2021-06-12 DIAGNOSIS — E1165 Type 2 diabetes mellitus with hyperglycemia: Secondary | ICD-10-CM | POA: Insufficient documentation

## 2021-06-12 MED ORDER — LOSARTAN POTASSIUM 50 MG PO TABS: 50.0000 mg | ORAL_TABLET | Freq: Every day | ORAL | 0 refills | Status: DC

## 2021-06-12 MED ORDER — GLIMEPIRIDE 2 MG PO TABS: ORAL_TABLET | ORAL | 0 refills | Status: DC

## 2021-06-12 MED ORDER — LEVOTHYROXINE SODIUM 125 MCG PO TABS: 125.0000 ug | ORAL_TABLET | Freq: Every day | ORAL | 0 refills | Status: DC

## 2021-06-12 MED ORDER — ATORVASTATIN CALCIUM 10 MG PO TABS: 10.0000 mg | ORAL_TABLET | Freq: Every day | ORAL | 0 refills | Status: DC

## 2021-06-12 MED ORDER — SITAGLIP PHOS-METFORMIN HCL ER 50-1000 MG PO TB24: 1.0000 | ORAL_TABLET | Freq: Every day | ORAL | 0 refills | Status: DC

## 2021-06-12 MED ORDER — HYDROCHLOROTHIAZIDE 25 MG PO TABS: 25.0000 mg | ORAL_TABLET | Freq: Every day | ORAL | 0 refills | Status: DC

## 2021-06-12 NOTE — Assessment & Plan Note (Signed)
Chronic - Losartan & HCTZ - slightly high today, pt recently restarted all meds, will recheck next visit and adjust doses if needed.

## 2021-06-12 NOTE — Progress Notes (Signed)
New Patient Office Visit  Subjective:  Patient ID: Brian Marks, male    DOB: 04/15/1951  Age: 71 y.o. MRN: [id number]  CC:  Chief Complaint  Patient presents with   Establish Care   Diabetes   Hypertension   Hypothyroidism   Hyperlipidemia    HPI Brian Marks presents for establishing care and to discuss a few problems.  Patient presents today for follow up of multiple medical problems. Diabetes Type 2 Pt is currently maintained on the following medications for diabetes:Glimeperide, Janumet, Actos Last diabetic eye exam was: unknown, will order next visit. Med compliance: poor, inbetween PCPs, seen in ER for BS >350, given 30d supply, pt reports he has been taking daily for last 3 weeks. Denies polyuria/polydipsia. Denies hypoglycemia. Home glucose readings range 100-130 fasting. 200-300 postprandiol. Lab Results  Component Value Date   HGBA1C 13.2 (H) 05/14/2021    Lab Results  Component Value Date   CREATININE 1.48 (H) 05/14/2021  Hyperlipidemia Patient is currently maintained on the following medication for hyperlipidemia: Lipitor Patient denies myalgia. Patient reports fair compliance with low fat/low cholesterol diet.  Last lipid panel as follows: unknown, recently restarted med, will check labs in 2 mos. Hypertension Patient is currently maintained on the following medications for blood pressure: Losartan/HCTZ Patient reports poor compliance with blood pressure medications - ran out before could see new PCP. Patient denies chest pain, shortness of breath, headache, dizziness, or swelling. Last 3 blood pressure readings in our office are as follows: BP Readings from Last 3 Encounters:  06/12/21 (!) 157/83  05/14/21 (!) 155/94  02/16/21 (!) 188/89  Hypothyroidism: Patient presents today for followup of Hypothyroidism.  Patient reports poor compliance with daily medication - ran out of med, but has taken for last 3 weeks. Patient denies any of the following  symptoms: fatigue, cold intolerance, constipation, weight gain or inability to lose weight, muscle weakness, mental slowing, dry hair and skin. Last TSH and free T4: Lab Results  Component Value Date   TSH 36.986 (H) 05/14/2021      Past Medical History:  Diagnosis Date   Cancer ([place]) 2008   Prostate    Past Surgical History:  Procedure Laterality Date   PROSTATE SURGERY  2008    Family History  Problem Relation Age of Onset   Kidney disease Mother    Hyperlipidemia Other    Hypertension Other     Social History   Socioeconomic History   Marital status: Married    Spouse name: Not on file   Number of children: Not on file   Years of education: Not on file   Highest education level: Not on file  Occupational History   Not on file  Tobacco Use   Smoking status: Never   Smokeless tobacco: Never  Vaping Use   Vaping Use: Never used  Substance and Sexual Activity   Alcohol use: No   Drug use: No   Sexual activity: Not on file  Other Topics Concern   Not on file  Social History Narrative   Not on file   Social Determinants of Health   Financial Resource Strain: Not on file  Food Insecurity: Not on file  Transportation Needs: Not on file  Physical Activity: Not on file  Stress: Not on file  Social Connections: Not on file  Intimate Partner Violence: Not on file    Objective:   Today's Vitals: BP (!) 157/83    Pulse 78    Temp (!) 97.2  F (36.2 C) (Temporal)    Ht 6\' 2"  (1.88 m)    Wt 263 lb 9.6 oz (119.6 kg)    SpO2 98%    BMI 33.84 kg/m   Physical Exam Vitals and nursing note reviewed.  Constitutional:      General: He is not in acute distress.    Appearance: Normal appearance. He is obese.  HENT:     Head: Normocephalic.  Cardiovascular:     Rate and Rhythm: Normal rate and regular rhythm.  Pulmonary:     Effort: Pulmonary effort is normal.     Breath sounds: Normal breath sounds.  Musculoskeletal:        General: Normal range of motion.      Cervical back: Normal range of motion.  Skin:    General: Skin is warm and dry.  Neurological:     Mental Status: He is alert and oriented to person, place, and time.  Psychiatric:        Mood and Affect: Mood normal.    Assessment & Plan:   Problem List Items Addressed This Visit       Cardiovascular and Mediastinum   Hypertension associated with diabetes ([place])    Chronic - Losartan & HCTZ - slightly high today, pt recently restarted all meds, will recheck next visit and adjust doses if needed.      Relevant Medications   atorvastatin (LIPITOR) 10 MG tablet   glimepiride (AMARYL) 2 MG tablet   hydrochlorothiazide (HYDRODIURIL) 25 MG tablet   losartan (COZAAR) 50 MG tablet   SitaGLIPtin-MetFORMIN HCl 50-1000 MG TB24     Endocrine   Uncontrolled diabetes mellitus with hyperglycemia, without long-term current use of insulin (HCC) - Primary    Chronic - unstable - A1C in ER on 12/22 13.2, pt inbetween PCPs and ran out of meds. ER gave 30d supply of Glimeperide, Janumet, Actos. Fasting CBGs 105-135. postprandiol readings up to 300 x1, most in lower 200s since back on meds. pt reports trying to exercise more, drinking plenty of water, eating lower carb diet. Advised to increase Glimeperide to 2 pills qam/1pill qpm, stop Actos, continue other doses & continue checking fasting CBG & postprandiol. all refills sent for 90d and f/u in 2 mos w/labs.      Relevant Medications   atorvastatin (LIPITOR) 10 MG tablet   glimepiride (AMARYL) 2 MG tablet   losartan (COZAAR) 50 MG tablet   SitaGLIPtin-MetFORMIN HCl 50-1000 MG TB24   Hyperlipidemia associated with type 2 diabetes mellitus (HCC)    Chronic - Lipitor, recently restarted, recheck labs in 2 mos      Relevant Medications   atorvastatin (LIPITOR) 10 MG tablet   glimepiride (AMARYL) 2 MG tablet   hydrochlorothiazide (HYDRODIURIL) 25 MG tablet   losartan (COZAAR) 50 MG tablet   SitaGLIPtin-MetFORMIN HCl 50-1000 MG TB24   Acquired  hypothyroidism    Chronic - Unstable - TSH >35 in ER a month ago, pt back on med, will recheck labs in 2 mos      Relevant Medications   levothyroxine (SYNTHROID) 125 MCG tablet   CKD stage 2 due to type 2 diabetes mellitus (HCC)    GFR 51 on 05/15/2021, advised on proper water hydration, avoid NSAIDs, recheck labs in 2 mos      Relevant Medications   atorvastatin (LIPITOR) 10 MG tablet   glimepiride (AMARYL) 2 MG tablet   losartan (COZAAR) 50 MG tablet   SitaGLIPtin-MetFORMIN HCl 50-1000 MG TB24    Outpatient Encounter  Medications as of 06/12/2021  Medication Sig   Accu-Chek Softclix Lancets lancets E11.65 check glucose TID and PRN   aspirin 81 MG tablet Take 81 mg by mouth daily.     glucose blood (ACCU-CHEK AVIVA PLUS) test strip E11.65 Check glucose TID and PRN   tadalafil (CIALIS) 20 MG tablet Take 20 mg by mouth daily as needed.   [DISCONTINUED] atorvastatin (LIPITOR) 10 MG tablet Take 10 mg by mouth daily.     [DISCONTINUED] glimepiride (AMARYL) 2 MG tablet Take 1 tablet (2 mg total) by mouth 2 (two) times daily.   [DISCONTINUED] hydrochlorothiazide (HYDRODIURIL) 25 MG tablet Take 1 tablet (25 mg total) by mouth daily.   [DISCONTINUED] levothyroxine (SYNTHROID) 125 MCG tablet Take 1 tablet (125 mcg total) by mouth daily before breakfast.   [DISCONTINUED] losartan (COZAAR) 50 MG tablet Take 1 tablet (50 mg total) by mouth daily.   [DISCONTINUED] pioglitazone (ACTOS) 15 MG tablet Take 1 tablet (15 mg total) by mouth daily.   [DISCONTINUED] SitaGLIPtin-MetFORMIN HCl 50-1000 MG TB24 Take 1 tablet by mouth daily.   atorvastatin (LIPITOR) 10 MG tablet Take 1 tablet (10 mg total) by mouth daily.   glimepiride (AMARYL) 2 MG tablet Take 2 pills in the am with breakfast and 1 pill with supper   hydrochlorothiazide (HYDRODIURIL) 25 MG tablet Take 1 tablet (25 mg total) by mouth daily.   levothyroxine (SYNTHROID) 125 MCG tablet Take 1 tablet (125 mcg total) by mouth daily before breakfast.    losartan (COZAAR) 50 MG tablet Take 1 tablet (50 mg total) by mouth daily.   SitaGLIPtin-MetFORMIN HCl 50-1000 MG TB24 Take 1 tablet by mouth daily.   [DISCONTINUED] diclofenac (VOLTAREN) 25 MG EC tablet Take 25 mg by mouth as needed.   (Patient not taking: Reported on 05/14/2021)   [DISCONTINUED] lidocaine (XYLOCAINE) 2 % jelly Apply topically as needed. (Patient not taking: Reported on 06/12/2021)   [DISCONTINUED] miconazole (MICOTIN) 2 % cream Apply 1 application topically 2 (two) times daily. (Patient not taking: Reported on 06/12/2021)   [DISCONTINUED] nystatin cream (MYCOSTATIN) Apply to affected area 2 times daily (Patient not taking: Reported on 06/12/2021)   [DISCONTINUED] polyethylene glycol powder (GLYCOLAX/MIRALAX) 17 GM/SCOOP powder Take 17 g by mouth daily. (Patient not taking: Reported on 06/12/2021)   [DISCONTINUED] sitaGLIPtin (JANUVIA) 100 MG tablet Take 100 mg by mouth daily.   (Patient not taking: Reported on 05/14/2021)   No facility-administered encounter medications on file as of 06/12/2021.    Follow-up: Return in about 2 months (around 08/10/2021) for diabetes f/u with fasting labs.   Jeanie Sewer, NP

## 2021-06-12 NOTE — Assessment & Plan Note (Signed)
Chronic - Lipitor, recently restarted, recheck labs in 2 mos

## 2021-06-12 NOTE — Patient Instructions (Addendum)
Welcome to [facility]! It was a pleasure meeting you today.  As discussed, I have refilled all of your meds for 90 days.  Continue checking a fasting blood sugar and before supper readings.  Continue exercising and drink at least 64oz water daily! Please schedule a 2 month follow up visit today with fasting labs.      PLEASE NOTE:  If you had any LAB tests please let us know if you have not heard back within a few days. You may see your results on MyChart before we have a chance to review them but we will give you a call once they are reviewed by [place]. If we ordered any REFERRALS today, please let us know if you have not heard from their office within the next week.  Let us know through MyChart if you are needing REFILLS, or have your pharmacy send [place] the request. You can also use MyChart to communicate with me or any office staff.  Please try these tips to maintain a healthy lifestyle:  Eat most of your calories during the day when you are active. Eliminate processed foods including packaged sweets (pies, cakes, cookies), reduce intake of potatoes, white bread, white pasta, and white rice. Look for whole grain options, oat flour or almond flour.  Each meal should contain half fruits/vegetables, one quarter protein, and one quarter carbs (no bigger than a computer mouse).  Cut down on sweet beverages. This includes juice, soda, and sweet tea. Also watch fruit intake, though this is a healthier sweet option, it still contains natural sugar! Limit to 3 servings daily.  Drink at least 1 glass of water with each meal and aim for at least 8 glasses per day  Exercise at least 150 minutes every week.

## 2021-06-12 NOTE — Assessment & Plan Note (Addendum)
GFR 51 on 05/15/2021, advised on proper water hydration, avoid NSAIDs, recheck labs in 2 mos

## 2021-06-12 NOTE — Assessment & Plan Note (Addendum)
Chronic - unstable - A1C in ER on 12/22 13.2, pt inbetween PCPs and ran out of meds. ER gave 30d supply of Glimeperide, Janumet, Actos. Fasting CBGs 105-135. postprandiol readings up to 300 x1, most in lower 200s since back on meds. pt reports trying to exercise more, drinking plenty of water, eating lower carb diet. Advised to increase Glimeperide to 2 pills qam/1pill qpm, stop Actos, continue other doses & continue checking fasting CBG & postprandiol. all refills sent for 90d and f/u in 2 mos w/labs.

## 2021-06-12 NOTE — Assessment & Plan Note (Signed)
Chronic - Unstable - TSH >35 in ER a month ago, pt back on med, will recheck labs in 2 mos

## 2021-08-08 DIAGNOSIS — M25562 Pain in left knee: Secondary | ICD-10-CM | POA: Diagnosis not present

## 2021-08-11 ENCOUNTER — Encounter: Payer: Self-pay | Admitting: Family

## 2021-08-11 ENCOUNTER — Ambulatory Visit (INDEPENDENT_AMBULATORY_CARE_PROVIDER_SITE_OTHER): Payer: Medicare Other | Admitting: Family

## 2021-08-11 VITALS — BP 151/82 | HR 74 | Temp 98.7°F | Ht 74.0 in | Wt 258.4 lb

## 2021-08-11 DIAGNOSIS — E785 Hyperlipidemia, unspecified: Secondary | ICD-10-CM

## 2021-08-11 DIAGNOSIS — E1159 Type 2 diabetes mellitus with other circulatory complications: Secondary | ICD-10-CM

## 2021-08-11 DIAGNOSIS — E1165 Type 2 diabetes mellitus with hyperglycemia: Secondary | ICD-10-CM

## 2021-08-11 DIAGNOSIS — E1169 Type 2 diabetes mellitus with other specified complication: Secondary | ICD-10-CM

## 2021-08-11 DIAGNOSIS — E1122 Type 2 diabetes mellitus with diabetic chronic kidney disease: Secondary | ICD-10-CM

## 2021-08-11 DIAGNOSIS — E039 Hypothyroidism, unspecified: Secondary | ICD-10-CM | POA: Diagnosis not present

## 2021-08-11 DIAGNOSIS — N182 Chronic kidney disease, stage 2 (mild): Secondary | ICD-10-CM

## 2021-08-11 DIAGNOSIS — I10 Essential (primary) hypertension: Secondary | ICD-10-CM

## 2021-08-11 DIAGNOSIS — I152 Hypertension secondary to endocrine disorders: Secondary | ICD-10-CM

## 2021-08-11 LAB — COMPREHENSIVE METABOLIC PANEL
ALT: 17 U/L (ref 0–53)
AST: 25 U/L (ref 0–37)
Albumin: 4.2 g/dL (ref 3.5–5.2)
Alkaline Phosphatase: 50 U/L (ref 39–117)
BUN: 17 mg/dL (ref 6–23)
CO2: 28 mEq/L (ref 19–32)
Calcium: 9.7 mg/dL (ref 8.4–10.5)
Chloride: 102 mEq/L (ref 96–112)
Creatinine, Ser: 1.32 mg/dL (ref 0.40–1.50)
GFR: 54.56 mL/min — ABNORMAL LOW (ref >60.00)
Glucose, Bld: 153 mg/dL — ABNORMAL HIGH (ref 70–99)
Potassium: 4.8 mEq/L (ref 3.5–5.1)
Sodium: 137 mEq/L (ref 135–145)
Total Bilirubin: 0.5 mg/dL (ref 0.2–1.2)
Total Protein: 6.9 g/dL (ref 6.0–8.3)

## 2021-08-11 LAB — LIPID PANEL
Cholesterol: 95 mg/dL (ref 0–200)
HDL: 42.1 mg/dL (ref >39.00)
LDL Cholesterol: 42 mg/dL (ref 0–99)
NonHDL: 53
Total CHOL/HDL Ratio: 2
Triglycerides: 57 mg/dL (ref 0.0–149.0)
VLDL: 11.4 mg/dL (ref 0.0–40.0)

## 2021-08-11 LAB — POCT GLYCOSYLATED HEMOGLOBIN (HGB A1C): Hemoglobin A1C: 8.5 % — AB (ref 4.0–5.6)

## 2021-08-11 MED ORDER — SYNJARDY XR 10-1000 MG PO TB24
1.0000 | ORAL_TABLET | Freq: Every day | ORAL | 1 refills | Status: DC
Start: 2021-08-11 — End: 2021-11-11

## 2021-08-11 NOTE — Assessment & Plan Note (Addendum)
Not at goal -A1C 8.5 today, much better, goal is  <7.5. pt taking meds as directed. fasting CBGs 80-110, a few in 130s. pt doing much better with diet and exercise. Continue to encourage progress, f/u in 3 mos. ?

## 2021-08-11 NOTE — Assessment & Plan Note (Addendum)
Last GFR 51, rechecking labs today. ?

## 2021-08-11 NOTE — Patient Instructions (Addendum)
It was very nice to see you today! ? ?Go to the lab for blood work today. ? ?Continue to take your diabetes medicines as directed. ?I sent a referral to our podiatry office. ?Finish the [employer] that you have and I have sent Synjardy (Jardiance/Metformin) to your pharmacy, start this once the Janumet is finished, but let me know if too expensive. ?Increase the Losartan to 1 pill every morning and 1/2 pill in the evening with your other meds. I will send more pills to your pharmacy with the increased dose and you can start this when your current bottle runs out. ? ?Keep up the great work with getting your blood sugar down! ? ? ?PLEASE NOTE: ? ?If you had any lab tests please let us know if you have not heard back within a few days. You may see your results on MyChart before we have a chance to review them but we will give you a call once they are reviewed by [place]. If we ordered any referrals today, please let us know if you have not heard from their office within the next week.  ? ?Please try these tips to maintain a healthy lifestyle: ? ?Eat most of your calories during the day when you are active. Eliminate processed foods including packaged sweets (pies, cakes, cookies), reduce intake of potatoes, white bread, white pasta, and white rice. Look for whole grain options, oat flour or almond flour. ? ?Each meal should contain half fruits/vegetables, one quarter protein, and one quarter carbs (no bigger than a computer mouse). ? ?Cut down on sweet beverages. This includes juice, soda, and sweet tea. Also watch fruit intake, though this is a healthier sweet option, it still contains natural sugar! Limit to 3 servings daily. ? ?Drink at least 1 glass of water with each meal and aim for at least 8 glasses per day ? ?Exercise at least 150 minutes every week.  ? ?

## 2021-08-11 NOTE — Assessment & Plan Note (Signed)
Chronic - BP still high, reports taking Losartan & HCTZ qam - advised pt to increase Losartan to 1.5 pills, 1 pill in am and 1/2 pill in pm until current bottle finished, then start new '50mg'$  bid dose when bottle finished. ?

## 2021-08-11 NOTE — Progress Notes (Signed)
? ?Subjective:  ? ? ? Patient ID: Brian Marks, male    DOB: January 02, 1951, 71 y.o.   MRN: [id number] ? ?Chief Complaint  ?Patient presents with  ? Diabetes  ? Follow-up  ?  W/ Labs  ? ? ?HPI: ?T2DM with CKD stage 2: Pt is currently maintained on the following medications for diabetes: Janumet, Glimeperide,  ?Failed meds include: none ?Denies polyuria/polydipsia/polyphagia ?Denies hypoglycemia ?Home glucose readings range: 80-130 fasting ?Last A1C was  ?Lab Results  ?Component Value Date  ? HGBA1C 8.5 (A) 08/11/2021  ? HGBA1C 13.2 (H) 05/14/2021  ?Hypertension: Patient is currently maintained on the following medications for blood pressure: Losartan, HCTZ ?Failed meds include: none ?Patient reports good compliance with blood pressure medications. ?Patient denies chest pain, headaches, shortness of breath or swelling. ?Last 3 blood pressure readings in our office are as follows: ?BP Readings from Last 3 Encounters:  ?08/11/21 (!) 151/82  ?06/12/21 (!) 157/83  ?05/14/21 (!) 155/94  ?Hypothyroidism: Patient presents today for followup of Hypothyroidism.  ?Patient reports positive compliance with daily medication.  ?Patient denies any of the following symptoms: fatigue, cold intolerance, constipation, weight gain or inability to lose weight, muscle weakness, mental slowing, dry hair and skin. ?Last TSH and free T4: ?Lab Results  ?Component Value Date  ? FREE T4 1.21 08/11/2021  ? TSH 4.28 08/11/2021  ? TSH 36.986 (H) 05/14/2021  ?Hyperlipidemia: Patient is currently maintained on the following medication for hyperlipidemia: Lipitor. Patient denies myalgia or other side effects. ?Patient reports good compliance with low fat/low cholesterol diet.  ?Last lipid panel as follows: ?Lab Results  ?Component Value Date  ? CHOL 95 08/11/2021  ? HDL 42.10 08/11/2021  ? [place] 42 08/11/2021  ? TRIG 57.0 08/11/2021  ? CHOLHDL 2 08/11/2021  ? ?The ASCVD Risk score (Arnett DK, et al., 2019) failed to calculate for the following  reasons: ?  The valid total cholesterol range is 130 to 320 mg/dL ? ? ?Health Maintenance Due  ?Topic Date Due  ? COVID-19 Vaccine (1) Never done  ? Pneumonia Vaccine 67+ Years old (1 - PCV) Never done  ? FOOT EXAM  Never done  ? OPHTHALMOLOGY EXAM  Never done  ? Hepatitis C Screening  Never done  ? Zoster Vaccines- Shingrix (1 of 2) Never done  ? COLONOSCOPY (Pts 45-59yr Insurance coverage will need to be confirmed)  Never done  ? TETANUS/TDAP  09/23/2014  ? ? ?Past Medical History:  ?Diagnosis Date  ? Cancer ([facility] 2008  ? Prostate  ? ? ?Past Surgical History:  ?Procedure Laterality Date  ? PROSTATE SURGERY  2008  ? ? ?Outpatient Medications Prior to Visit  ?Medication Sig Dispense Refill  ? Accu-Chek Softclix Lancets lancets E11.65 check glucose TID and PRN 100 each 0  ? aspirin 81 MG tablet Take 81 mg by mouth daily.      ? atorvastatin (LIPITOR) 10 MG tablet Take 1 tablet (10 mg total) by mouth daily. 90 tablet 0  ? glimepiride (AMARYL) 2 MG tablet Take 2 pills in the am with breakfast and 1 pill with supper 270 tablet 0  ? glucose blood (ACCU-CHEK AVIVA PLUS) test strip E11.65 Check glucose TID and PRN 100 each 0  ? hydrochlorothiazide (HYDRODIURIL) 25 MG tablet Take 1 tablet (25 mg total) by mouth daily. 90 tablet 0  ? levothyroxine (SYNTHROID) 125 MCG tablet Take 1 tablet (125 mcg total) by mouth daily before breakfast. 90 tablet 0  ? losartan (COZAAR) 50 MG tablet Take 1  tablet (50 mg total) by mouth daily. 90 tablet 0  ? tadalafil (CIALIS) 20 MG tablet Take 20 mg by mouth daily as needed.    ? SitaGLIPtin-MetFORMIN HCl 50-1000 MG TB24 Take 1 tablet by mouth daily. 90 tablet 0  ? ?No facility-administered medications prior to visit.  ? ? ?No Known Allergies ? ?   ?Objective:  ?  ?Physical Exam ?Vitals and nursing note reviewed.  ?Constitutional:   ?   General: He is not in acute distress. ?   Appearance: Normal appearance. He is obese.  ?HENT:  ?   Head: Normocephalic.  ?Cardiovascular:  ?   Rate and  Rhythm: Normal rate and regular rhythm.  ?Pulmonary:  ?   Effort: Pulmonary effort is normal.  ?   Breath sounds: Normal breath sounds.  ?Musculoskeletal:     ?   General: Normal range of motion.  ?   Cervical back: Normal range of motion.  ?Skin: ?   General: Skin is warm and dry.  ?Neurological:  ?   Mental Status: He is alert and oriented to person, place, and time.  ?Psychiatric:     ?   Mood and Affect: Mood normal.  ? ? ?BP (!) 151/82 (BP Location: Left Arm, Patient Position: Sitting, Cuff Size: Large)   Pulse 74   Temp 98.7 ?F (37.1 ?C) (Temporal)   Ht '6\' 2"'  (1.88 m)   Wt 258 lb 6 oz (117.2 kg)   SpO2 98%   BMI 33.17 kg/m?  ?Wt Readings from Last 3 Encounters:  ?08/11/21 258 lb 6 oz (117.2 kg)  ?06/12/21 263 lb 9.6 oz (119.6 kg)  ?11/30/19 260 lb (117.9 kg)  ? ? ?   ?Assessment & Plan:  ? ?Problem List Items Addressed This Visit   ? ?  ? Cardiovascular and Mediastinum  ? Hypertension associated with diabetes ([place])  ?  Chronic - BP still high, reports taking Losartan & HCTZ qam - advised pt to increase Losartan to 1.5 pills, 1 pill in am and 1/2 pill in pm until current bottle finished, then start new 58m bid dose when bottle finished. ?  ?  ? Relevant Medications  ? Empagliflozin-metFORMIN HCl ER (SYNJARDY XR) 02-999 MG TB24  ? Other Relevant Orders  ? Comp Met (CMET) (Completed)  ?  ? Endocrine  ? Uncontrolled diabetes mellitus with hyperglycemia, without long-term current use of insulin ([place] - Primary  ?  Not at goal -A1C 8.5 today, much better, goal is  <7.5. pt taking meds as directed. fasting CBGs 80-110, a few in 130s. pt doing much better with diet and exercise. Continue to encourage progress, f/u in 3 mos. ?  ?  ? Relevant Medications  ? Empagliflozin-metFORMIN HCl ER (SYNJARDY XR) 02-999 MG TB24  ? Other Relevant Orders  ? POCT HgB A1C (Completed)  ? Ambulatory referral to Podiatry  ? Comp Met (CMET) (Completed)  ? Hyperlipidemia associated with type 2 diabetes mellitus ([place]  ?  Chronic -  rechecking lipids today, pt taking Lipitor qd ?  ?  ? Relevant Medications  ? Empagliflozin-metFORMIN HCl ER (SYNJARDY XR) 02-999 MG TB24  ? Other Relevant Orders  ? Lipid panel (Completed)  ? Acquired hypothyroidism  ?  Chronic -rechecking levels today, pt taking med qd. ?  ?  ? Relevant Orders  ? T4, free (Completed)  ? TSH (Completed)  ? CKD stage 2 due to type 2 diabetes mellitus ([place]  ?  Last GFR 51, rechecking labs today. ?  ?  ?  Relevant Medications  ? Empagliflozin-metFORMIN HCl ER (SYNJARDY XR) 02-999 MG TB24  ? ? ?Meds ordered this encounter  ?Medications  ? Empagliflozin-metFORMIN HCl ER (SYNJARDY XR) 02-999 MG TB24  ?  Sig: Take 1 tablet by mouth daily. Start this when you have finished the Janumet.  ?  Dispense:  90 tablet  ?  Refill:  1  ?  do no fill until current Janumet bottle finished.  ?  Order Specific Question:   Supervising Provider  ?  Answer:   ANDY, CAMILLE L [[id number]]  ? ? ?Jeanie Sewer, NP ? ?

## 2021-08-11 NOTE — Assessment & Plan Note (Signed)
Chronic -rechecking levels today, pt taking med qd. ?

## 2021-08-11 NOTE — Assessment & Plan Note (Signed)
Chronic - rechecking lipids today, pt taking Lipitor qd ?

## 2021-08-13 LAB — TSH: TSH: 4.28 u[IU]/mL (ref 0.35–5.50)

## 2021-08-13 LAB — T4, FREE: Free T4: 1.21 ng/dL (ref 0.60–1.60)

## 2021-08-17 ENCOUNTER — Other Ambulatory Visit: Payer: Self-pay | Admitting: Family

## 2021-08-17 DIAGNOSIS — E1169 Type 2 diabetes mellitus with other specified complication: Secondary | ICD-10-CM

## 2021-08-17 MED ORDER — ROSUVASTATIN CALCIUM 5 MG PO TABS: 5.0000 mg | ORAL_TABLET | Freq: Every day | ORAL | 1 refills | Status: DC

## 2021-08-17 NOTE — Progress Notes (Signed)
Hi Brian Marks, ? ?As predicted, since your A1C came down, your blood sugar also was down during the visit from 396 to 153, but this is still high for fasting - we want this below 130, but I know you have had lower numbers by looking at your glucometer. ?Your kidney function also improved with getting the blood sugar down and drinking more water! ?Your thyroid numbers are in normal range so continue the same dose of Levothyroxine that you're taking. ?Cholesterol numbers are also very good, in fact the medicine might be a little strong, so I want to switch you to the lowest dose of Crestor instead, and I have sent this to your pharmacy. You can finish the Lipitor that you have, and then start the new Crestor. ? ?Great job overall, keep up great work!

## 2021-08-25 ENCOUNTER — Ambulatory Visit: Payer: Medicare Other | Admitting: Podiatrist

## 2021-08-25 DIAGNOSIS — M79675 Pain in left toe(s): Secondary | ICD-10-CM | POA: Diagnosis not present

## 2021-08-25 DIAGNOSIS — M79674 Pain in right toe(s): Secondary | ICD-10-CM

## 2021-08-25 DIAGNOSIS — E119 Type 2 diabetes mellitus without complications: Secondary | ICD-10-CM

## 2021-08-25 DIAGNOSIS — B351 Tinea unguium: Secondary | ICD-10-CM

## 2021-08-25 LAB — HM DIABETES FOOT EXAM: HM Diabetic Foot Exam: NORMAL

## 2021-08-25 NOTE — Patient Instructions (Signed)

## 2021-08-28 ENCOUNTER — Encounter: Payer: Self-pay | Admitting: Podiatrist

## 2021-08-28 NOTE — Progress Notes (Signed)
Subjective: ?Brian Marks is a 71 y.o. male patient with history of diabetes who presents to office today complaining of long,mildly painful nails  while ambulating in shoes; unable to trim.  Patient denies any new cramping, numbness, burning or tingling in the legs. He relates he can feel a tingling sensation at times when his blood sugar gets elevated. He states he works to keep his blood sugar under good control to prevent this from occurring. ? ?Patient states that the glucose reading this morning was -107 mg/dl. His last HgA1C was 8.5 on 08/11/21.   ? ?His primary care provider is Brian Sewer, NP  ? ?Patient Active Problem List  ? Diagnosis Date Noted  ? Uncontrolled diabetes mellitus with hyperglycemia, without long-term current use of insulin ([place]) 06/12/2021  ? Hypertension associated with diabetes ([place]) 06/12/2021  ? Hyperlipidemia associated with type 2 diabetes mellitus ([place]) 06/12/2021  ? Acquired hypothyroidism 06/12/2021  ? CKD stage 2 due to type 2 diabetes mellitus ([place]) 06/12/2021  ? ?Current Outpatient Medications on File Prior to Visit  ?Medication Sig Dispense Refill  ? Accu-Chek Softclix Lancets lancets E11.65 check glucose TID and PRN 100 each 0  ? aspirin 81 MG tablet Take 81 mg by mouth daily.      ? Empagliflozin-metFORMIN HCl ER (SYNJARDY XR) 02-999 MG TB24 Take 1 tablet by mouth daily. Start this when you have finished the Janumet. 90 tablet 1  ? glimepiride (AMARYL) 2 MG tablet Take 2 pills in the am with breakfast and 1 pill with supper 270 tablet 0  ? glucose blood (ACCU-CHEK AVIVA PLUS) test strip E11.65 Check glucose TID and PRN 100 each 0  ? hydrochlorothiazide (HYDRODIURIL) 25 MG tablet Take 1 tablet (25 mg total) by mouth daily. 90 tablet 0  ? levothyroxine (SYNTHROID) 125 MCG tablet Take 1 tablet (125 mcg total) by mouth daily before breakfast. 90 tablet 0  ? losartan (COZAAR) 50 MG tablet Take 1 tablet (50 mg total) by mouth daily. 90 tablet 0  ? rosuvastatin (CRESTOR) 5  MG tablet Take 1 tablet (5 mg total) by mouth daily. 90 tablet 1  ? tadalafil (CIALIS) 20 MG tablet Take 20 mg by mouth daily as needed.    ? ?No current facility-administered medications on file prior to visit.  ? ?No Known Allergies ? ? ? ?Objective: ?General: Patient is awake, alert, and oriented x 3 and in no acute distress. ? ?Integument: Skin is warm, dry and supple bilateral. Nails are tender, long, thickened and  ?dystrophic with subungual debris, consistent with onychomycosis, 1-5 bilateral. No signs of infection. No open lesions or preulcerative lesions present bilateral. Remaining integument unremarkable. ? ?Vasculature:  Dorsalis Pedis pulse 2/4 bilateral. Posterior Tibial pulse  1/4 bilateral.  ?Capillary fill time <3 sec 1-5 bilateral. Positive hair growth to the level of the digits. ?Temperature gradient within normal limits. No varicosities present bilateral. Mild edema is present left greater than right.   ? ?Neurology: The patient has intact sensation measured with a 5.07/10g Semmes Weinstein Monofilament at all pedal sites bilateral . Vibratory sensation diminished bilateral with tuning fork. No Babinski sign present bilateral.  ? ?Musculoskeletal: pes planus foot type is noted.  He wears good supportive New Balance running shoes.  Muscular strength 5/5 in all lower extremity muscular groups bilateral without pain on range of motion . No tenderness with calf compression bilateral. ? ?Assessment and Plan: ?  ICD-10-CM   ?1. Pain due to onychomycosis of toenails of both feet  B35.1   ?  L54.492   ? E10.071   ?  ?2. Encounter for diabetic foot exam ([place])  E11.9   ?  ? ? ? ? ?-Examined patient. ?-Discussed and educated patient on diabetic foot care, especially with  ?regards to the vascular, neurological and musculoskeletal systems.  ?-Stressed the importance of good glycemic control and the detriment of not  ?controlling glucose levels in relation to the foot. ?- digital nails were trimmed with a  sterile nail nipper and power burr without incident.  ?-Answered all patient questions and discussed the importance of continuing to wear high quality and supportive running shoes such as those he is wearing today.  ?-Patient advised to return in 3 months for continued care and to call the office if any problems or questions arise in the future.  ? ?Bronson Ing, DPM ?

## 2021-09-02 ENCOUNTER — Other Ambulatory Visit: Payer: Self-pay | Admitting: Family

## 2021-09-02 DIAGNOSIS — E1159 Type 2 diabetes mellitus with other circulatory complications: Secondary | ICD-10-CM

## 2021-09-16 ENCOUNTER — Other Ambulatory Visit: Payer: Self-pay | Admitting: Family

## 2021-09-16 DIAGNOSIS — E1159 Type 2 diabetes mellitus with other circulatory complications: Secondary | ICD-10-CM

## 2021-09-16 DIAGNOSIS — E1165 Type 2 diabetes mellitus with hyperglycemia: Secondary | ICD-10-CM

## 2021-09-16 DIAGNOSIS — E039 Hypothyroidism, unspecified: Secondary | ICD-10-CM

## 2021-09-30 ENCOUNTER — Other Ambulatory Visit: Payer: Self-pay | Admitting: Family

## 2021-09-30 DIAGNOSIS — E1165 Type 2 diabetes mellitus with hyperglycemia: Secondary | ICD-10-CM

## 2021-10-03 ENCOUNTER — Other Ambulatory Visit: Payer: Self-pay | Admitting: Family

## 2021-10-03 ENCOUNTER — Telehealth: Payer: Self-pay | Admitting: Family

## 2021-10-03 DIAGNOSIS — E1165 Type 2 diabetes mellitus with hyperglycemia: Secondary | ICD-10-CM

## 2021-10-03 NOTE — Telephone Encounter (Signed)
Tried to contact pt, was not able to lvm.  ?

## 2021-10-03 NOTE — Telephone Encounter (Signed)
Patient needs a refill for metformin to be sent to [facility] in [place] - patient has not been on metformin for 2 days no as he has ran out of it.  ?

## 2021-10-03 NOTE — Telephone Encounter (Signed)
so I sent Synjardy for him last time - did he never pick this up? Is it too expensive?  This was to replace his Janumet. If it is too expensive, ok to send the Janumet ER again - '100mg'$ -1,'000mg'$  ER - 90 pills with 1 refill.

## 2021-10-07 ENCOUNTER — Telehealth: Payer: Self-pay

## 2021-10-07 NOTE — Telephone Encounter (Signed)
Pt states he has not picked up Synjardy yet and just finished janumet. He is calling pharmacy to see if it is ready for pick up. I let pt know if he has any issues picking it up or to expensive to contact us.  ?

## 2021-11-10 NOTE — Progress Notes (Unsigned)
   Subjective:     Patient ID: Brian Marks, male    DOB: 1950/09/05, 71 y.o.   MRN: [id number]  No chief complaint on file.   HPI: T2DM with CKD stage 2: Pt is currently maintained on the following medications for diabetes: Janumet, Glimeperide,  Failed meds include: none Denies polyuria/polydipsia/polyphagia Denies hypoglycemia Home glucose readings range: 80-130 fasting Hypertension: Patient is currently maintained on the following medications for blood pressure: Losartan, HCTZ Failed meds include: none Patient reports good compliance with blood pressure medications. Patient denies chest pain, headaches, shortness of breath or swelling.  Assessment & Plan:   Problem List Items Addressed This Visit   None   Outpatient Medications Prior to Visit  Medication Sig Dispense Refill   Accu-Chek Softclix Lancets lancets E11.65 check glucose TID and PRN 100 each 0   aspirin 81 MG tablet Take 81 mg by mouth daily.       Empagliflozin-metFORMIN HCl ER (SYNJARDY XR) 02-999 MG TB24 Take 1 tablet by mouth daily. Start this when you have finished the Janumet. 90 tablet 1   glimepiride (AMARYL) 2 MG tablet TAKE 2 TABLETS BY MOUTH IN THE MORNING WITH BREAKFAST AND 1 WITH SUPPER 270 tablet 0   glucose blood (ACCU-CHEK AVIVA PLUS) test strip E11.65 Check glucose TID and PRN 100 each 0   hydrochlorothiazide (HYDRODIURIL) 25 MG tablet Take 1 tablet by mouth once daily 90 tablet 0   levothyroxine (SYNTHROID) 125 MCG tablet TAKE 1 TABLET BY MOUTH BEFORE BREAKFAST 90 tablet 0   losartan (COZAAR) 50 MG tablet Take 1 tablet by mouth once daily 90 tablet 0   rosuvastatin (CRESTOR) 5 MG tablet Take 1 tablet (5 mg total) by mouth daily. 90 tablet 1   tadalafil (CIALIS) 20 MG tablet Take 20 mg by mouth daily as needed.     No facility-administered medications prior to visit.    Past Medical History:  Diagnosis Date   Cancer [facility]) 2008   Prostate    Past Surgical History:  Procedure Laterality  Date   PROSTATE SURGERY  2008    No Known Allergies     Objective:    Physical Exam  There were no vitals taken for this visit. Wt Readings from Last 3 Encounters:  08/11/21 258 lb 6 oz (117.2 kg)  06/12/21 263 lb 9.6 oz (119.6 kg)  11/30/19 260 lb (117.9 kg)        No orders of the defined types were placed in this encounter.   Jeanie Sewer, NP

## 2021-11-11 ENCOUNTER — Encounter: Payer: Self-pay | Admitting: Family

## 2021-11-11 ENCOUNTER — Ambulatory Visit (INDEPENDENT_AMBULATORY_CARE_PROVIDER_SITE_OTHER): Payer: Medicare Other | Admitting: Family

## 2021-11-11 VITALS — BP 133/75 | HR 80 | Temp 98.0°F | Ht 74.0 in | Wt 241.1 lb

## 2021-11-11 DIAGNOSIS — E1165 Type 2 diabetes mellitus with hyperglycemia: Secondary | ICD-10-CM

## 2021-11-11 DIAGNOSIS — E1122 Type 2 diabetes mellitus with diabetic chronic kidney disease: Secondary | ICD-10-CM | POA: Diagnosis not present

## 2021-11-11 DIAGNOSIS — Z860100 Personal history of colon polyps, unspecified: Secondary | ICD-10-CM | POA: Insufficient documentation

## 2021-11-11 DIAGNOSIS — I152 Hypertension secondary to endocrine disorders: Secondary | ICD-10-CM

## 2021-11-11 DIAGNOSIS — N182 Chronic kidney disease, stage 2 (mild): Secondary | ICD-10-CM

## 2021-11-11 DIAGNOSIS — R194 Change in bowel habit: Secondary | ICD-10-CM | POA: Insufficient documentation

## 2021-11-11 DIAGNOSIS — C61 Malignant neoplasm of prostate: Secondary | ICD-10-CM | POA: Insufficient documentation

## 2021-11-11 DIAGNOSIS — E1159 Type 2 diabetes mellitus with other circulatory complications: Secondary | ICD-10-CM

## 2021-11-11 DIAGNOSIS — Z1211 Encounter for screening for malignant neoplasm of colon: Secondary | ICD-10-CM | POA: Insufficient documentation

## 2021-11-11 DIAGNOSIS — K625 Hemorrhage of anus and rectum: Secondary | ICD-10-CM | POA: Insufficient documentation

## 2021-11-11 HISTORY — DX: Encounter for screening for malignant neoplasm of colon: Z12.11

## 2021-11-11 HISTORY — DX: Change in bowel habit: R19.4

## 2021-11-11 LAB — POCT GLYCOSYLATED HEMOGLOBIN (HGB A1C): Hemoglobin A1C: 9.8 % — AB (ref 4.0–5.6)

## 2021-11-11 LAB — COMPREHENSIVE METABOLIC PANEL
ALT: 32 U/L (ref 0–53)
AST: 36 U/L (ref 0–37)
Albumin: 4.1 g/dL (ref 3.5–5.2)
Alkaline Phosphatase: 66 U/L (ref 39–117)
BUN: 23 mg/dL (ref 6–23)
CO2: 30 mEq/L (ref 19–32)
Calcium: 10.1 mg/dL (ref 8.4–10.5)
Chloride: 97 mEq/L (ref 96–112)
Creatinine, Ser: 1.57 mg/dL — ABNORMAL HIGH (ref 0.40–1.50)
GFR: 44.23 mL/min — ABNORMAL LOW (ref >60.00)
Glucose, Bld: 235 mg/dL — ABNORMAL HIGH (ref 70–99)
Potassium: 3.6 mEq/L (ref 3.5–5.1)
Sodium: 134 mEq/L — ABNORMAL LOW (ref 135–145)
Total Bilirubin: 0.6 mg/dL (ref 0.2–1.2)
Total Protein: 7.5 g/dL (ref 6.0–8.3)

## 2021-11-11 MED ORDER — GLIMEPIRIDE 2 MG PO TABS: ORAL_TABLET | ORAL | 0 refills | Status: DC

## 2021-11-11 MED ORDER — ACCU-CHEK AVIVA PLUS VI STRP: ORAL_STRIP | 0 refills | Status: DC

## 2021-11-11 MED ORDER — SYNJARDY XR 10-1000 MG PO TB24: 2.0000 | ORAL_TABLET | Freq: Every day | ORAL | 1 refills | Status: DC

## 2021-11-11 NOTE — Assessment & Plan Note (Addendum)
Chronic - A1C 9.8 - did not change - pt without synjardy for about 2 weeks, has taken med for last 30d, weight down 17lbs. taking glimeperide bid. Increasing dose of Glimeperide to 2 pills bid, Synjardy to 2pills qd. f/u in 3 mos.

## 2021-11-11 NOTE — Assessment & Plan Note (Signed)
Chronic - stable on Losartan & HCTZ, f/u in 3 mos.

## 2021-11-11 NOTE — Patient Instructions (Addendum)
It was very nice to see you today!   Go to the lab for blood work today.  Your A1C is 9.8, your goal is < 7.0 - your weight is down 17lbs! That is terrific, keep working on losing weight, another 20-30lbs will be perfect and will help lower your blood sugar also, along with cardio exercise for about 40mnutes daily.  Keep taking the Synjardy, but increase to 2 pills daily, and increase your Glimeperide to 2 pills in the evenings in addition to the 2 pills in the morning.  Continue all of your other meds.  Follow up in 3 mos.     PLEASE NOTE:  If you had any lab tests please let [facility] if you have not heard back within a few days. You may see your results on MyChart before we have a chance to review them but we will give you a call once they are reviewed by [facility] If we ordered any referrals today, please let [facility] if you have not heard from their office within the next week.

## 2021-11-25 ENCOUNTER — Other Ambulatory Visit: Payer: Self-pay | Admitting: Family

## 2021-11-25 DIAGNOSIS — E1159 Type 2 diabetes mellitus with other circulatory complications: Secondary | ICD-10-CM

## 2021-12-01 ENCOUNTER — Ambulatory Visit: Payer: Medicare Other | Admitting: Podiatry

## 2021-12-01 DIAGNOSIS — M79674 Pain in right toe(s): Secondary | ICD-10-CM | POA: Diagnosis not present

## 2021-12-01 DIAGNOSIS — B351 Tinea unguium: Secondary | ICD-10-CM | POA: Diagnosis not present

## 2021-12-01 DIAGNOSIS — M79675 Pain in left toe(s): Secondary | ICD-10-CM

## 2021-12-01 NOTE — Progress Notes (Signed)
   SUBJECTIVE Patient with a history of diabetes mellitus presents to office today complaining of elongated, thickened nails that cause pain while ambulating in shoes.  Patient is unable to trim their own nails. Patient is here for further evaluation and treatment.   Past Medical History:  Diagnosis Date   Cancer [facility]) 2008   Prostate    OBJECTIVE General Patient is awake, alert, and oriented x 3 and in no acute distress. Derm Skin is dry and supple bilateral. Negative open lesions or macerations. Remaining integument unremarkable. Nails are tender, long, thickened and dystrophic with subungual debris, consistent with onychomycosis, 1-5 bilateral. No signs of infection noted. Vasc  DP and PT pedal pulses palpable bilaterally. Temperature gradient within normal limits.  Neuro Epicritic and protective threshold sensation diminished bilaterally.  Musculoskeletal Exam No symptomatic pedal deformities noted bilateral. Muscular strength within normal limits.  ASSESSMENT 1. Diabetes Mellitus w/ peripheral neuropathy 2.  Pain due to onychomycosis of toenails bilateral  PLAN OF CARE 1. Patient evaluated today. 2. Instructed to maintain good pedal hygiene and foot care. Stressed importance of controlling blood sugar.  3. Mechanical debridement of nails 1-5 bilaterally performed using a nail nipper. Filed with dremel without incident.  4. Return to clinic in 3 mos.     Edrick Kins, DPM [facility]  Dr. Edrick Kins, DPM    [address], [place] [address]                Office [phone]  Fax [phone]

## 2021-12-11 ENCOUNTER — Other Ambulatory Visit: Payer: Self-pay | Admitting: Family

## 2021-12-11 DIAGNOSIS — E1165 Type 2 diabetes mellitus with hyperglycemia: Secondary | ICD-10-CM

## 2021-12-16 ENCOUNTER — Ambulatory Visit (INDEPENDENT_AMBULATORY_CARE_PROVIDER_SITE_OTHER): Payer: Medicare Other

## 2021-12-16 DIAGNOSIS — Z0000 Encounter for general adult medical examination without abnormal findings: Secondary | ICD-10-CM | POA: Diagnosis not present

## 2021-12-16 DIAGNOSIS — Z599 Problem related to housing and economic circumstances, unspecified: Secondary | ICD-10-CM | POA: Diagnosis not present

## 2021-12-16 NOTE — Progress Notes (Signed)
Virtual Visit via Telephone Note  I connected with  Brian Marks on 12/16/21 at 11:45 AM EDT by telephone and verified that I am speaking with the correct person using two identifiers.  Medicare Annual Wellness visit completed telephonically due to Covid-19 pandemic.   Persons participating in this call: This Health Coach and this patient.   Location: Patient: home Provider: office   I discussed the limitations, risks, security and privacy concerns of performing an evaluation and management service by telephone and the availability of in person appointments. The patient expressed understanding and agreed to proceed.  Unable to perform video visit due to video visit attempted and failed and/or patient does not have video capability.   Some vital signs may be absent or patient reported.   Willette Brace, LPN   Subjective:   Brian Marks is a 71 y.o. male who presents for an Initial Medicare Annual Wellness Visit.  Review of Systems     Cardiac Risk Factors include: advanced age (>33mn, >>46women);dyslipidemia;male gender;hypertension;diabetes mellitus;obesity (BMI >30kg/m2)     Objective:    There were no vitals filed for this visit. There is no height or weight on file to calculate BMI.     12/16/2021   11:59 AM 11/30/2019    3:42 PM  Advanced Directives  Does Patient Have a Medical Advance Directive? No No  Would patient like information on creating a medical advance directive? Yes (MAU/Ambulatory/Procedural Areas - Information given) No - Patient declined    Current Medications (verified) Outpatient Encounter Medications as of 12/16/2021  Medication Sig   Accu-Chek Softclix Lancets lancets E11.65 check glucose TID and PRN   aspirin 81 MG tablet Take 81 mg by mouth daily.     Empagliflozin-metFORMIN HCl ER (SYNJARDY XR) 02-999 MG TB24 Take 2 tablets by mouth daily. OK to take 1 pill twice a day if preferred.   glimepiride (AMARYL) 2 MG tablet TAKE 2 TABLETS BY MOUTH  IN THE MORNING WITH BREAKFAST AND 1 TABLET IN THE EVENING WITH SUPPER   glucose blood (ACCU-CHEK AVIVA PLUS) test strip E11.65 Check glucose TID and PRN   hydrochlorothiazide (HYDRODIURIL) 25 MG tablet Take 1 tablet by mouth once daily   levothyroxine (SYNTHROID) 125 MCG tablet TAKE 1 TABLET BY MOUTH BEFORE BREAKFAST   losartan (COZAAR) 50 MG tablet Take 1 tablet by mouth once daily   rosuvastatin (CRESTOR) 5 MG tablet Take 1 tablet (5 mg total) by mouth daily.   tadalafil (CIALIS) 20 MG tablet Take 20 mg by mouth daily as needed.   No facility-administered encounter medications on file as of 12/16/2021.    Allergies (verified) Patient has no known allergies.   History: Past Medical History:  Diagnosis Date   [place]   Prostate   Past Surgical History:  Procedure Laterality Date   PROSTATE SURGERY  2008   Family History  Problem Relation Age of Onset   Kidney disease Mother    Hyperlipidemia Other    Hypertension Other    Social History   Socioeconomic History   Marital status: Married    Spouse name: Not on file   Number of children: Not on file   Years of education: Not on file   Highest education level: Not on file  Occupational History   Not on file  Tobacco Use   Smoking status: Never   Smokeless tobacco: Never  Vaping Use   Vaping Use: Never used  Substance and Sexual Activity   Alcohol use:  No   Drug use: No   Sexual activity: Not on file  Other Topics Concern   Not on file  Social History Narrative   Not on file   Social Determinants of Health   Financial Resource Strain: Low Risk  (12/16/2021)   Overall Financial Resource Strain (CARDIA)    Difficulty of Paying Living Expenses: Not hard at all  Food Insecurity: No Food Insecurity (12/16/2021)   Hunger Vital Sign    Worried About Running Out of Food in the Last Year: Never true    Ran Out of Food in the Last Year: Never true  Transportation Needs: No Transportation Needs (12/16/2021)    PRAPARE - Hydrologist (Medical): No    Lack of Transportation (Non-Medical): No  Physical Activity: Inactive (12/16/2021)   Exercise Vital Sign    Days of Exercise per Week: 0 days    Minutes of Exercise per Session: 0 min  Stress: No Stress Concern Present (12/16/2021)   [place]    Feeling of Stress : Not at all  Social Connections: Moderately Integrated (12/16/2021)   Social Connection and Isolation Panel [NHANES]    Frequency of Communication with Friends and Family: More than three times a week    Frequency of Social Gatherings with Friends and Family: More than three times a week    Attends Religious Services: More than 4 times per year    Active Member of [employer] or Organizations: No    Attends Music therapist: Never    Marital Status: Married    Tobacco Counseling Counseling given: Not Answered   Clinical Intake:  Pre-visit preparation completed: Yes  Pain : No/denies pain     BMI - recorded: 30.96 Nutritional Status: BMI > 30  Obese Nutritional Risks: None Diabetes: Yes CBG done?: No Did pt. bring in CBG monitor from home?: No  How often do you need to have someone help you when you read instructions, pamphlets, or other written materials from your doctor or pharmacy?: 1 - Never  Diabetic?Nutrition Risk Assessment:  Has the patient had any N/V/D within the last 2 months?  No  Does the patient have any non-healing wounds?  No  Has the patient had any unintentional weight loss or weight gain?  No   Diabetes:  Is the patient diabetic?  Yes  If diabetic, was a CBG obtained today?  No  Did the patient bring in their glucometer from home?  No  How often do you monitor your CBG's? A couple days a week.   Financial Strains and Diabetes Management:  Are you having any financial strains with the device, your supplies or your medication? Yes . Pt stated  synjardy is expense  Does the patient want to be seen by Chronic Care Management for management of their diabetes?  No  Would the patient like to be referred to a Nutritionist or for Diabetic Management?  No   Diabetic Exams:  Diabetic Eye Exam: Overdue for diabetic eye exam. Pt has been advised about the importance in completing this exam. Patient advised to call and schedule an eye exam. Diabetic Foot Exam: Overdue, Pt has been advised about the importance in completing this exam. Pt is scheduled for diabetic foot exam on next appt .   Interpreter Needed?: No  Information entered by :: Charlott Rakes, LPn   Activities of Daily Living    12/16/2021   12:01 PM  In  your present state of health, do you have any difficulty performing the following activities:  Hearing? 0  Vision? 0  Difficulty concentrating or making decisions? 0  Walking or climbing stairs? 0  Dressing or bathing? 0  Doing errands, shopping? 0  Preparing Food and eating ? N  Using the Toilet? N  In the past six months, have you accidently leaked urine? N  Do you have problems with loss of bowel control? N  Managing your Medications? N  Managing your Finances? N  Housekeeping or managing your Housekeeping? N    Patient Care Team: Jeanie Sewer, NP as PCP - General (Family Medicine)  Indicate any recent Medical Services you may have received from other than [facility] providers in the past year (date may be approximate).     Assessment:   This is a routine wellness examination for Julia.  Hearing/Vision screen Hearing Screening - Comments:: Pt denies any hearing issues  Vision Screening - Comments:: Pt will follow up with Dr Sabra Heck in august   Dietary issues and exercise activities discussed: Current Exercise Habits: The patient has a physically strenuous job, but has no regular exercise apart from work.   Goals Addressed             This Visit's Progress    Patient Stated       Blood sugar to  normal readings        Depression Screen    12/16/2021   11:57 AM 06/12/2021    8:55 AM  PHQ 2/9 Scores  PHQ - 2 Score 0 0    Fall Risk    12/16/2021   12:01 PM 06/12/2021    8:55 AM  [place] in the past year? 0 0  Number falls in past yr: 0   Injury with Fall? 0   Follow up Falls prevention discussed     [place]:  Any stairs in or around the home? Yes  If so, are there any without handrails? No  Home free of loose throw rugs in walkways, pet beds, electrical cords, etc? Yes  Adequate lighting in your home to reduce risk of falls? Yes   ASSISTIVE DEVICES UTILIZED TO PREVENT FALLS:  Life alert? Yes  Use of a cane, walker or w/c? No  Grab bars in the bathroom? No  Shower chair or bench in shower? No  Elevated toilet seat or a handicapped toilet? No   TIMED UP AND GO:  Was the test performed? No .   Cognitive Function: declined at this time         Immunizations Immunization History  Administered Date(s) Administered   Fluad Quad(high Dose 65+) 03/11/2016, 04/08/2021   Influenza, High Dose Seasonal PF 04/17/2018, 03/16/2019   Pneumococcal Polysaccharide-23 11/30/2012   Tdap 09/22/2004   Zoster Recombinat (Shingrix) 03/31/2021    TDAP status: Due, Education has been provided regarding the importance of this vaccine. Advised may receive this vaccine at local pharmacy or Health Dept. Aware to provide a copy of the vaccination record if obtained from local pharmacy or Health Dept. Verbalized acceptance and understanding.  Flu Vaccine status: Up to date  Pneumococcal vaccine status: Due, Education has been provided regarding the importance of this vaccine. Advised may receive this vaccine at local pharmacy or Health Dept. Aware to provide a copy of the vaccination record if obtained from local pharmacy or Health Dept. Verbalized acceptance and understanding.  Covid-19 vaccine status: Declined, Education has been  provided regarding the importance of this vaccine but patient still declined. Advised may receive this vaccine at local pharmacy or Health Dept.or vaccine clinic. Aware to provide a copy of the vaccination record if obtained from local pharmacy or Health Dept. Verbalized acceptance and understanding.  Qualifies for Shingles Vaccine? Yes   Zostavax completed Yes   Shingrix Completed?: Yes  Screening Tests Health Maintenance  Topic Date Due   FOOT EXAM  Never done   Hepatitis C Screening  Never done   COLONOSCOPY (Pts 45-36yr Insurance coverage will need to be confirmed)  Never done   TETANUS/TDAP  09/23/2014   Pneumonia Vaccine 71 Years old (2 - PCV) 11/16/2015   Zoster Vaccines- Shingrix (2 of 2) 05/26/2021   OPHTHALMOLOGY EXAM  01/21/2022 (Originally 11/15/1960)   INFLUENZA VACCINE  12/23/2021   HEMOGLOBIN A1C  05/13/2022   HPV VACCINES  Aged Out   COVID-19 Vaccine  Discontinued    Health Maintenance  Health Maintenance Due  Topic Date Due   FOOT EXAM  Never done   Hepatitis C Screening  Never done   COLONOSCOPY (Pts 45-490yrInsurance coverage will need to be confirmed)  Never done   TETANUS/TDAP  09/23/2014   Pneumonia Vaccine 6598Years old (2 - PCV) 11/16/2015   Zoster Vaccines- Shingrix (2 of 2) 05/26/2021    Pt stated completed within last year    Additional Screening:  Hepatitis C Screening: does qualify;  Vision Screening: Recommended annual ophthalmology exams for early detection of glaucoma and other disorders of the eye. Is the patient up to date with their annual eye exam?  No  Who is the provider or what is the name of the office in which the patient attends annual eye exams? Appt with Dr MiSabra Heckn august  If pt is not established with a provider, would they like to be referred to a provider to establish care? No .   Dental Screening: Recommended annual dental exams for proper oral hygiene  Community Resource Referral / Chronic Care Management: CRR  required this visit?  No   CCM required this visit?  No      Plan:     I have personally reviewed and noted the following in the patient's chart:   Medical and social history Use of alcohol, tobacco or illicit drugs  Current medications and supplements including opioid prescriptions. Patient is not currently taking opioid prescriptions. Functional ability and status Nutritional status Physical activity Advanced directives List of other physicians Hospitalizations, surgeries, and ER visits in previous 12 months Vitals Screenings to include cognitive, depression, and falls Referrals and appointments  In addition, I have reviewed and discussed with patient certain preventive protocols, quality metrics, and best practice recommendations. A written personalized care plan for preventive services as well as general preventive health recommendations were provided to patient.     TiWillette BraceLPN   11/26/07/8119 Nurse Notes: None

## 2021-12-16 NOTE — Patient Instructions (Signed)
Brian Marks , Thank you for taking time to come for your Medicare Wellness Visit. I appreciate your ongoing commitment to your health goals. Please review the following plan we discussed and let me know if I can assist you in the future.   Screening recommendations/referrals: Colonoscopy: pt stated completed  Recommended yearly ophthalmology/optometry visit for glaucoma screening and checkup Recommended yearly dental visit for hygiene and checkup  Vaccinations: Influenza vaccine: done 04/08/21 repeat every year  Pneumococcal vaccine: due  Tdap vaccine: due  Shingles vaccine: completed 03/31/21   Covid-19: declined   Advanced directives: Please bring a copy of your health care power of attorney and living will to the office at your convenience.  Conditions/risks identified: get blood sugar to normal readings and maintain health  Next appointment: Follow up in one year for your annual wellness visit.   Preventive Care 70 Years and Older, Male Preventive care refers to lifestyle choices and visits with your health care provider that can promote health and wellness. What does preventive care include? A yearly physical exam. This is also called an annual well check. Dental exams once or twice a year. Routine eye exams. Ask your health care provider how often you should have your eyes checked. Personal lifestyle choices, including: Daily care of your teeth and gums. Regular physical activity. Eating a healthy diet. Avoiding tobacco and drug use. Limiting alcohol use. Practicing safe sex. Taking low doses of aspirin every day. Taking vitamin and mineral supplements as recommended by your health care provider. What happens during an annual well check? The services and screenings done by your health care provider during your annual well check will depend on your age, overall health, lifestyle risk factors, and family history of disease. Counseling  Your health care provider may ask you  questions about your: Alcohol use. Tobacco use. Drug use. Emotional well-being. Home and relationship well-being. Sexual activity. Eating habits. History of falls. Memory and ability to understand (cognition). Work and work Statistician. Screening  You may have the following tests or measurements: Height, weight, and BMI. Blood pressure. Lipid and cholesterol levels. These may be checked every 5 years, or more frequently if you are over 94 years old. Skin check. Lung cancer screening. You may have this screening every year starting at age 67 if you have a 30-pack-year history of smoking and currently smoke or have quit within the past 15 years. Fecal occult blood test (FOBT) of the stool. You may have this test every year starting at age 31. Flexible sigmoidoscopy or colonoscopy. You may have a sigmoidoscopy every 5 years or a colonoscopy every 10 years starting at age 57. Prostate cancer screening. Recommendations will vary depending on your family history and other risks. Hepatitis C blood test. Hepatitis B blood test. Sexually transmitted disease (STD) testing. Diabetes screening. This is done by checking your blood sugar (glucose) after you have not eaten for a while (fasting). You may have this done every 1-3 years. Abdominal aortic aneurysm (AAA) screening. You may need this if you are a current or former smoker. Osteoporosis. You may be screened starting at age 20 if you are at high risk. Talk with your health care provider about your test results, treatment options, and if necessary, the need for more tests. Vaccines  Your health care provider may recommend certain vaccines, such as: Influenza vaccine. This is recommended every year. Tetanus, diphtheria, and acellular pertussis (Tdap, Td) vaccine. You may need a Td booster every 10 years. Zoster vaccine. You may need  this after age 90. Pneumococcal 13-valent conjugate (PCV13) vaccine. One dose is recommended after age  44. Pneumococcal polysaccharide (PPSV23) vaccine. One dose is recommended after age 97. Talk to your health care provider about which screenings and vaccines you need and how often you need them. This information is not intended to replace advice given to you by your health care provider. Make sure you discuss any questions you have with your health care provider. Document Released: 06/07/2015 Document Revised: 01/29/2016 Document Reviewed: 03/12/2015 Elsevier Interactive Patient Education  2017 [place] Prevention in the Home Falls can cause injuries. They can happen to people of all ages. There are many things you can do to make your home safe and to help prevent falls. What can I do on the outside of my home? Regularly fix the edges of walkways and driveways and fix any cracks. Remove anything that might make you trip as you walk through a door, such as a raised step or threshold. Trim any bushes or trees on the path to your home. Use bright outdoor lighting. Clear any walking paths of anything that might make someone trip, such as rocks or tools. Regularly check to see if handrails are loose or broken. Make sure that both sides of any steps have handrails. Any raised decks and porches should have guardrails on the edges. Have any leaves, snow, or ice cleared regularly. Use sand or salt on walking paths during winter. Clean up any spills in your garage right away. This includes oil or grease spills. What can I do in the bathroom? Use night lights. Install grab bars by the toilet and in the tub and shower. Do not use towel bars as grab bars. Use non-skid mats or decals in the tub or shower. If you need to sit down in the shower, use a plastic, non-slip stool. Keep the floor dry. Clean up any water that spills on the floor as soon as it happens. Remove soap buildup in the tub or shower regularly. Attach bath mats securely with double-sided non-slip rug tape. Do not have throw  rugs and other things on the floor that can make you trip. What can I do in the bedroom? Use night lights. Make sure that you have a light by your bed that is easy to reach. Do not use any sheets or blankets that are too big for your bed. They should not hang down onto the floor. Have a firm chair that has side arms. You can use this for support while you get dressed. Do not have throw rugs and other things on the floor that can make you trip. What can I do in the kitchen? Clean up any spills right away. Avoid walking on wet floors. Keep items that you use a lot in easy-to-reach places. If you need to reach something above you, use a strong step stool that has a grab bar. Keep electrical cords out of the way. Do not use floor polish or wax that makes floors slippery. If you must use wax, use non-skid floor wax. Do not have throw rugs and other things on the floor that can make you trip. What can I do with my stairs? Do not leave any items on the stairs. Make sure that there are handrails on both sides of the stairs and use them. Fix handrails that are broken or loose. Make sure that handrails are as long as the stairways. Check any carpeting to make sure that it is firmly attached to  the stairs. Fix any carpet that is loose or worn. Avoid having throw rugs at the top or bottom of the stairs. If you do have throw rugs, attach them to the floor with carpet tape. Make sure that you have a light switch at the top of the stairs and the bottom of the stairs. If you do not have them, ask someone to add them for you. What else can I do to help prevent falls? Wear shoes that: Do not have high heels. Have rubber bottoms. Are comfortable and fit you well. Are closed at the toe. Do not wear sandals. If you use a stepladder: Make sure that it is fully opened. Do not climb a closed stepladder. Make sure that both sides of the stepladder are locked into place. Ask someone to hold it for you, if  possible. Clearly mark and make sure that you can see: Any grab bars or handrails. First and last steps. Where the edge of each step is. Use tools that help you move around (mobility aids) if they are needed. These include: Canes. Walkers. Scooters. Crutches. Turn on the lights when you go into a dark area. Replace any light bulbs as soon as they burn out. Set up your furniture so you have a clear path. Avoid moving your furniture around. If any of your floors are uneven, fix them. If there are any pets around you, be aware of where they are. Review your medicines with your doctor. Some medicines can make you feel dizzy. This can increase your chance of falling. Ask your doctor what other things that you can do to help prevent falls. This information is not intended to replace advice given to you by your health care provider. Make sure you discuss any questions you have with your health care provider. Document Released: 03/07/2009 Document Revised: 10/17/2015 Document Reviewed: 06/15/2014 Elsevier Interactive Patient Education  2017 [employer].

## 2021-12-19 ENCOUNTER — Telehealth: Payer: Self-pay

## 2021-12-19 NOTE — Telephone Encounter (Signed)
   Telephone encounter was:  Unsuccessful.  12/19/2021 Name: Brian Marks MRN: [id number] DOB: 04-May-1951  Unsuccessful outbound call made today to assist with:  Financial Difficulties related to medication.  Outreach Attempt:  1st Attempt  A HIPAA compliant voice message was left requesting a return call.  Instructed patient to call back at [phone].  Cathe Bilger, AAS Paralegal, [facility]  [address],  [address] ??[email or web address]  ?? [phone]   [email or web address]

## 2021-12-23 ENCOUNTER — Telehealth: Payer: Self-pay

## 2021-12-23 NOTE — Telephone Encounter (Signed)
   Telephone encounter was:  Successful.  12/23/2021 Name: Brian Marks MRN: [id number] DOB: 1950/08/23  Brian Marks is a 71 y.o. year old male who is a primary care patient of Jeanie Sewer, NP . The community resource team was consulted for assistance with Financial Difficulties related to medication.  Care guide performed the following interventions: Spoke with patient about Development worker, community of [employer] [phone].  This program assist clients with information and applying for the Medicare Extrahelp Program.                                                                                                                                                 12/19/21-Left message on voicemail for patient to return my call regarding medication assistance. Patient mentioned that he is about to enter the Medicare Part D donut hole/coverage gap.  I advised the patient to tell the [facility] counselor at [employer]. Patient does not qualify for the [place] savings card.  Follow Up Plan:  Care guide will follow up with patient by phone over the next 7 days.  Freja Faro, AAS Paralegal, [facility]  [address], [place] [address] ??[email or web address]  ?? [phone]   [email or web address]

## 2021-12-24 ENCOUNTER — Other Ambulatory Visit: Payer: Self-pay | Admitting: Family

## 2021-12-24 DIAGNOSIS — E039 Hypothyroidism, unspecified: Secondary | ICD-10-CM

## 2021-12-25 ENCOUNTER — Other Ambulatory Visit: Payer: Self-pay | Admitting: Family

## 2021-12-25 DIAGNOSIS — I152 Hypertension secondary to endocrine disorders: Secondary | ICD-10-CM

## 2021-12-31 ENCOUNTER — Telehealth: Payer: Self-pay

## 2021-12-31 NOTE — Telephone Encounter (Signed)
   Telephone encounter was:  Unsuccessful.  12/31/2021 Name: Brian Marks MRN: [id number] DOB: 16-Aug-1950  Unsuccessful outbound call made today to assist with:  Left message on voicemail to followup with patient regarding information given for [facility] [place] program for [employer] . Left my number and the number for [facility] [place].  Outreach Attempt:  3rd Attempt.  Referral closed unable to contact patient.  A HIPAA compliant voice message was left requesting a return call.  Instructed patient to call back at [phone].  Jolyssa Oplinger, AAS Paralegal, [facility]  [address], [place] [address] ??[email or web address]  ?? [phone]   [email or web address]

## 2022-01-06 ENCOUNTER — Telehealth: Payer: Self-pay | Admitting: Family

## 2022-01-06 NOTE — Telephone Encounter (Signed)
Pharmacy states they need to know if it's okay to use a different manufacturer of the generic levothyroxine for the patient. They can be reached at [phone] for more information.

## 2022-01-06 NOTE — Telephone Encounter (Signed)
yes, that is fine...never been asked this before, only if pt was on the brand, Synthroid. But I don't see any notes that he requested brand over generic.  thx

## 2022-01-07 NOTE — Telephone Encounter (Signed)
I called and spoke with pharmacy, Pharmacy stated they received all information that was needed yesterday on 01/08/2022.

## 2022-01-12 DIAGNOSIS — H524 Presbyopia: Secondary | ICD-10-CM | POA: Diagnosis not present

## 2022-01-12 DIAGNOSIS — I10 Essential (primary) hypertension: Secondary | ICD-10-CM | POA: Diagnosis not present

## 2022-01-12 DIAGNOSIS — H35033 Hypertensive retinopathy, bilateral: Secondary | ICD-10-CM | POA: Diagnosis not present

## 2022-01-12 LAB — HM DIABETES EYE EXAM

## 2022-02-16 ENCOUNTER — Encounter: Payer: Self-pay | Admitting: *Deleted

## 2022-02-16 ENCOUNTER — Ambulatory Visit: Payer: Medicare Other | Admitting: Family

## 2022-02-27 ENCOUNTER — Other Ambulatory Visit: Payer: Self-pay | Admitting: Family

## 2022-02-27 DIAGNOSIS — E1165 Type 2 diabetes mellitus with hyperglycemia: Secondary | ICD-10-CM

## 2022-02-27 DIAGNOSIS — I152 Hypertension secondary to endocrine disorders: Secondary | ICD-10-CM

## 2022-03-02 ENCOUNTER — Ambulatory Visit (INDEPENDENT_AMBULATORY_CARE_PROVIDER_SITE_OTHER): Payer: Medicare Other | Admitting: Podiatry

## 2022-03-02 DIAGNOSIS — M79674 Pain in right toe(s): Secondary | ICD-10-CM | POA: Diagnosis not present

## 2022-03-02 DIAGNOSIS — M79675 Pain in left toe(s): Secondary | ICD-10-CM

## 2022-03-02 DIAGNOSIS — B351 Tinea unguium: Secondary | ICD-10-CM | POA: Diagnosis not present

## 2022-03-02 LAB — HM DIABETES FOOT EXAM: HM Diabetic Foot Exam: NORMAL

## 2022-03-02 NOTE — Progress Notes (Signed)
   Chief Complaint  Patient presents with   foot care    Patient is here for diabetic foot care.    SUBJECTIVE Patient with a history of diabetes mellitus presents to office today complaining of elongated, thickened nails that cause pain while ambulating in shoes.  Patient is unable to trim their own nails. Patient is here for further evaluation and treatment.  Past Medical History:  Diagnosis Date   Cancer ([place]) 2008   Prostate    No Known Allergies   OBJECTIVE General Patient is awake, alert, and oriented x 3 and in no acute distress. Derm Skin is dry and supple bilateral. Negative open lesions or macerations. Remaining integument unremarkable. Nails are tender, long, thickened and dystrophic with subungual debris, consistent with onychomycosis, 1-5 bilateral. No signs of infection noted. Vasc  DP and PT pedal pulses palpable bilaterally. Temperature gradient within normal limits.  Neuro Epicritic and protective threshold sensation diminished bilaterally.  Musculoskeletal Exam No symptomatic pedal deformities noted bilateral. Muscular strength within normal limits.  ASSESSMENT 1. Diabetes Mellitus w/ peripheral neuropathy 2.  Pain due to onychomycosis of toenails bilateral  PLAN OF CARE 1. Patient evaluated today. 2. Instructed to maintain good pedal hygiene and foot care. Stressed importance of controlling blood sugar.  3. Mechanical debridement of nails 1-5 bilaterally performed using a nail nipper. Filed with dremel without incident.  4. Return to clinic in 3 mos.     Edrick Kins, DPM [facility]  Dr. Edrick Kins, DPM    [address], [place] [address]                Office [phone]  Fax [phone]

## 2022-03-04 ENCOUNTER — Encounter: Payer: Self-pay | Admitting: Family

## 2022-03-04 ENCOUNTER — Ambulatory Visit: Payer: Medicare Other | Admitting: Family

## 2022-03-04 VITALS — BP 134/71 | HR 82 | Temp 98.0°F | Ht 74.0 in | Wt 234.0 lb

## 2022-03-04 DIAGNOSIS — E1169 Type 2 diabetes mellitus with other specified complication: Secondary | ICD-10-CM | POA: Diagnosis not present

## 2022-03-04 DIAGNOSIS — I152 Hypertension secondary to endocrine disorders: Secondary | ICD-10-CM | POA: Diagnosis not present

## 2022-03-04 DIAGNOSIS — Z23 Encounter for immunization: Secondary | ICD-10-CM

## 2022-03-04 DIAGNOSIS — E1159 Type 2 diabetes mellitus with other circulatory complications: Secondary | ICD-10-CM

## 2022-03-04 DIAGNOSIS — E1122 Type 2 diabetes mellitus with diabetic chronic kidney disease: Secondary | ICD-10-CM

## 2022-03-04 DIAGNOSIS — E785 Hyperlipidemia, unspecified: Secondary | ICD-10-CM

## 2022-03-04 DIAGNOSIS — E1165 Type 2 diabetes mellitus with hyperglycemia: Secondary | ICD-10-CM

## 2022-03-04 DIAGNOSIS — N182 Chronic kidney disease, stage 2 (mild): Secondary | ICD-10-CM | POA: Diagnosis not present

## 2022-03-04 LAB — POCT GLYCOSYLATED HEMOGLOBIN (HGB A1C): Hemoglobin A1C: 8.9 % — AB (ref 4.0–5.6)

## 2022-03-04 MED ORDER — ROSUVASTATIN CALCIUM 5 MG PO TABS: 5.0000 mg | ORAL_TABLET | Freq: Every day | ORAL | 1 refills | Status: DC

## 2022-03-04 MED ORDER — METFORMIN HCL ER 500 MG PO TB24: 1000.0000 mg | ORAL_TABLET | Freq: Two times a day (BID) | ORAL | 3 refills | Status: DC

## 2022-03-04 NOTE — Progress Notes (Signed)
Patient ID: Brian Marks, male    DOB: 01/24/1951, 71 y.o.   MRN: [id number]  Chief Complaint  Patient presents with   Diabetes    HPI: T2DM with CKD stage 2: Pt is currently maintained on the following medications for diabetes: Synjardy, Glimeperide,  Failed meds include: none. Pt reports Iva Boop is very expensive, has been paying $300 for 30d supply. Ran out of Glimeperide a few days ago. Denies polyuria/polydipsia/polyphagia Denies hypoglycemia Home glucose readings range: pt has not been checking  Hypertension: Patient is currently maintained on the following medications for blood pressure: Losartan, HCTZ Patient reports good compliance with blood pressure medications. Patient denies chest pain, headaches, shortness of breath or swelling. Last 3 blood pressure readings in our office are as follows: BP Readings from Last 3 Encounters:  03/04/22 134/71  11/11/21 133/75  08/11/21 (!) 151/82   Hyperlipidemia: Patient is currently maintained on the following medication for hyperlipidemia: Crestor. Patient denies myalgia or other side effects. Patient reports good compliance with low fat/low cholesterol diet.  Last lipid panel as follows: Lab Results  Component Value Date   CHOL 95 08/11/2021   HDL 42.10 08/11/2021   LDLCALC 42 08/11/2021   TRIG 57.0 08/11/2021   CHOLHDL 2 08/11/2021    Assessment & Plan:   Problem List Items Addressed This Visit       Cardiovascular and Mediastinum   Hypertension associated with diabetes ([place])    chronic taking Losartan & HCTZ qd, BP wnl no refills needed f/u in 3 mos      Relevant Medications   rosuvastatin (CRESTOR) 5 MG tablet   metFORMIN (GLUCOPHAGE-XR) 500 MG 24 hr tablet     Endocrine   Uncontrolled diabetes mellitus with hyperglycemia, without long-term current use of insulin (HCC) - Primary    chronic A1C today 8.9, down from 9.8 pt took synjardy bid in beginning but then had to pay $300 for last refill so he has  been taking just qd to make last. weight down 7lbs  pt has number for Senior resource center to ask about assistance for cost of med refilling just Metformin for now pt very motivated to continue to get BS down, walking daily, eating better, taking all other meds as directed f/u in 3 mos      Relevant Medications   rosuvastatin (CRESTOR) 5 MG tablet   metFORMIN (GLUCOPHAGE-XR) 500 MG 24 hr tablet   Other Relevant Orders   POCT HgB A1C (Completed)   Hyperlipidemia associated with type 2 diabetes mellitus (HCC)    Chronic taking Crestor '5mg'$  qd, c/o mild muscle cramps, not bad sending refill today will check labs next visit, if LDL still low, will decrease dose to qod f/u in 3 mos      Relevant Medications   rosuvastatin (CRESTOR) 5 MG tablet   metFORMIN (GLUCOPHAGE-XR) 500 MG 24 hr tablet   CKD stage 2 due to type 2 diabetes mellitus ([place])    chronic last GFR 44 in 07/2021  started on Synjardy - however pt paying $300 for 30d, d/t donut hole - pt took for 3 months, discontinuing today waiting to see if local senior resource center can assist w/cost continue to advised on water hydration, no NSAIDs      Relevant Medications   rosuvastatin (CRESTOR) 5 MG tablet   metFORMIN (GLUCOPHAGE-XR) 500 MG 24 hr tablet   Other Visit Diagnoses     Need for immunization against influenza       Relevant Orders  Flu Vaccine QUAD High Dose(Fluad) (Completed)      Subjective:    Outpatient Medications Prior to Visit  Medication Sig Dispense Refill   Accu-Chek Softclix Lancets lancets E11.65 check glucose TID and PRN 100 each 0   aspirin 81 MG tablet Take 81 mg by mouth daily.       glimepiride (AMARYL) 2 MG tablet TAKE 2 TABLETS BY MOUTH IN THE MORNING WITH BREAKFAST AND 1 TABLET IN THE EVENING WITH SUPPER 270 tablet 0   glucose blood (ACCU-CHEK AVIVA PLUS) test strip E11.65 Check glucose TID and PRN 100 each 0   hydrochlorothiazide (HYDRODIURIL) 25 MG tablet Take 1 tablet by mouth  once daily 90 tablet 0   levothyroxine (SYNTHROID) 125 MCG tablet TAKE 1 TABLET BY MOUTH BEFORE BREAKFAST 90 tablet 0   losartan (COZAAR) 50 MG tablet Take 1 tablet by mouth once daily 90 tablet 0   tadalafil (CIALIS) 20 MG tablet Take 20 mg by mouth daily as needed.     Empagliflozin-metFORMIN HCl ER (SYNJARDY XR) 02-999 MG TB24 Take 2 tablets by mouth daily. OK to take 1 pill twice a day if preferred. 180 tablet 1   rosuvastatin (CRESTOR) 5 MG tablet Take 1 tablet (5 mg total) by mouth daily. 90 tablet 1   No facility-administered medications prior to visit.   Past Medical History:  Diagnosis Date   Cancer [facility]) 2008   Prostate   Screening for malignant neoplasm of colon 11/11/2021   Past Surgical History:  Procedure Laterality Date   PROSTATE SURGERY  2008   No Known Allergies    Objective:    Physical Exam Vitals and nursing note reviewed.  Constitutional:      General: He is not in acute distress.    Appearance: Normal appearance.  HENT:     Head: Normocephalic.  Cardiovascular:     Rate and Rhythm: Normal rate and regular rhythm.  Pulmonary:     Effort: Pulmonary effort is normal.     Breath sounds: Normal breath sounds.  Musculoskeletal:        General: Normal range of motion.     Cervical back: Normal range of motion.  Skin:    General: Skin is warm and dry.  Neurological:     Mental Status: He is alert and oriented to person, place, and time.  Psychiatric:        Mood and Affect: Mood normal.    BP 134/71 (BP Location: Left Arm, Patient Position: Sitting, Cuff Size: Large)   Pulse 82   Temp 98 F (36.7 C) (Temporal)   Ht '6\' 2"'$  (1.88 m)   Wt 234 lb (106.1 kg)   SpO2 96%   BMI 30.04 kg/m  Wt Readings from Last 3 Encounters:  03/04/22 234 lb (106.1 kg)  11/11/21 241 lb 2 oz (109.4 kg)  08/11/21 258 lb 6 oz (117.2 kg)       Jeanie Sewer, NP

## 2022-03-04 NOTE — Patient Instructions (Addendum)
It was very nice to see you today!   You A1C is down to 8.9 - great job on taking your medication, exercising and changing your diet!! You're getting closer to your goal of 7.0, keep up the good work!  I am sending in just the Metformin to replace the [facility] for now. Call the number below to see if they can help with the cost of the [place].  This is the contact info that our Wellness nurse provided for you to help with the cost of the Synjardy: [employer] [phone].  This program assist clients with information and applying for the Medicare Extrahelp Program.   Schedule a 3 month follow up visit.                                                                                                   PLEASE NOTE:  If you had any lab tests please let us know if you have not heard back within a few days. You may see your results on MyChart before we have a chance to review them but we will give you a call once they are reviewed by [place]. If we ordered any referrals today, please let us know if you have not heard from their office within the next week.

## 2022-03-04 NOTE — Assessment & Plan Note (Signed)
   chronic  taking Losartan & HCTZ qd, BP wnl  no refills needed  f/u in 3 mos

## 2022-03-04 NOTE — Assessment & Plan Note (Signed)
   chronic  A1C today 8.9, down from 9.8  pt took synjardy bid in beginning but then had to pay $300 for last refill so he has been taking just qd to make last.  weight down 7lbs   pt has number for Senior resource center to ask about assistance for cost of med  refilling just Metformin for now  pt very motivated to continue to get BS down, walking daily, eating better, taking all other meds as directed  f/u in 3 mos

## 2022-03-04 NOTE — Assessment & Plan Note (Signed)
   Chronic  taking Crestor '5mg'$  qd, c/o mild muscle cramps, not bad  sending refill today  will check labs next visit, if LDL still low, will decrease dose to qod  f/u in 3 mos

## 2022-03-04 NOTE — Assessment & Plan Note (Addendum)
   chronic  last GFR 44 in 07/2021   started on Synjardy - however pt paying $300 for 30d, d/t donut hole - pt took for 3 months, discontinuing today  waiting to see if local senior resource center can assist w/cost  continue to advised on water hydration, no NSAIDs

## 2022-04-06 ENCOUNTER — Other Ambulatory Visit: Payer: Self-pay | Admitting: Family

## 2022-04-06 DIAGNOSIS — I152 Hypertension secondary to endocrine disorders: Secondary | ICD-10-CM

## 2022-04-24 ENCOUNTER — Other Ambulatory Visit: Payer: Self-pay | Admitting: Family

## 2022-04-24 DIAGNOSIS — E039 Hypothyroidism, unspecified: Secondary | ICD-10-CM

## 2022-06-01 ENCOUNTER — Other Ambulatory Visit: Payer: Self-pay | Admitting: Family

## 2022-06-01 DIAGNOSIS — E1165 Type 2 diabetes mellitus with hyperglycemia: Secondary | ICD-10-CM

## 2022-06-01 DIAGNOSIS — E1159 Type 2 diabetes mellitus with other circulatory complications: Secondary | ICD-10-CM

## 2022-06-03 ENCOUNTER — Ambulatory Visit (INDEPENDENT_AMBULATORY_CARE_PROVIDER_SITE_OTHER): Payer: Medicare Other | Admitting: Podiatry

## 2022-06-03 VITALS — BP 144/77 | HR 85

## 2022-06-03 DIAGNOSIS — B351 Tinea unguium: Secondary | ICD-10-CM

## 2022-06-03 DIAGNOSIS — M79675 Pain in left toe(s): Secondary | ICD-10-CM

## 2022-06-03 DIAGNOSIS — M79674 Pain in right toe(s): Secondary | ICD-10-CM

## 2022-06-03 NOTE — Progress Notes (Signed)
   Chief Complaint  Patient presents with   Diabetes    Diabetic foot care, A1c-8.9 BG-not taking,  Nail trim     SUBJECTIVE Patient with a history of diabetes mellitus presents to office today complaining of elongated, thickened nails that cause pain while ambulating in shoes.  Patient is unable to trim their own nails. Patient is here for further evaluation and treatment.  Past Medical History:  Diagnosis Date   Cancer [facility]) 2008   Prostate   Screening for malignant neoplasm of colon 11/11/2021    No Known Allergies   OBJECTIVE General Patient is awake, alert, and oriented x 3 and in no acute distress. Derm Skin is dry and supple bilateral. Negative open lesions or macerations. Remaining integument unremarkable. Nails are tender, long, thickened and dystrophic with subungual debris, consistent with onychomycosis, 1-5 bilateral. No signs of infection noted. Vasc  DP and PT pedal pulses palpable bilaterally. Temperature gradient within normal limits.  Neuro Epicritic and protective threshold sensation diminished bilaterally.  Musculoskeletal Exam No symptomatic pedal deformities noted bilateral. Muscular strength within normal limits.  ASSESSMENT 1. Diabetes Mellitus w/ peripheral neuropathy 2.  Pain due to onychomycosis of toenails bilateral  PLAN OF CARE 1. Patient evaluated today. 2. Instructed to maintain good pedal hygiene and foot care. Stressed importance of controlling blood sugar.  3. Mechanical debridement of nails 1-5 bilaterally performed using a nail nipper. Filed with dremel without incident.  4. Return to clinic in 3 mos.     Edrick Kins, DPM [facility]  Dr. Edrick Kins, DPM    [address], [place] [address]                Office [phone]  Fax [phone]

## 2022-06-27 ENCOUNTER — Other Ambulatory Visit: Payer: Self-pay | Admitting: Family

## 2022-06-27 DIAGNOSIS — E1165 Type 2 diabetes mellitus with hyperglycemia: Secondary | ICD-10-CM

## 2022-07-09 ENCOUNTER — Other Ambulatory Visit: Payer: Self-pay | Admitting: Family

## 2022-07-09 DIAGNOSIS — E1159 Type 2 diabetes mellitus with other circulatory complications: Secondary | ICD-10-CM

## 2022-07-19 ENCOUNTER — Other Ambulatory Visit: Payer: Self-pay | Admitting: Family

## 2022-07-19 DIAGNOSIS — E039 Hypothyroidism, unspecified: Secondary | ICD-10-CM

## 2022-08-05 ENCOUNTER — Encounter: Payer: Self-pay | Admitting: Podiatry

## 2022-08-19 ENCOUNTER — Other Ambulatory Visit: Payer: Self-pay | Admitting: Family

## 2022-08-19 DIAGNOSIS — E1159 Type 2 diabetes mellitus with other circulatory complications: Secondary | ICD-10-CM

## 2022-08-19 DIAGNOSIS — E1169 Type 2 diabetes mellitus with other specified complication: Secondary | ICD-10-CM

## 2022-08-19 DIAGNOSIS — E039 Hypothyroidism, unspecified: Secondary | ICD-10-CM

## 2022-08-19 DIAGNOSIS — E1165 Type 2 diabetes mellitus with hyperglycemia: Secondary | ICD-10-CM

## 2022-09-02 ENCOUNTER — Ambulatory Visit: Payer: Medicare Other | Admitting: Podiatry

## 2022-11-23 ENCOUNTER — Other Ambulatory Visit: Payer: Self-pay | Admitting: Family

## 2022-11-23 DIAGNOSIS — E1169 Type 2 diabetes mellitus with other specified complication: Secondary | ICD-10-CM

## 2022-11-23 DIAGNOSIS — E1159 Type 2 diabetes mellitus with other circulatory complications: Secondary | ICD-10-CM

## 2022-11-25 ENCOUNTER — Other Ambulatory Visit: Payer: Self-pay | Admitting: Family

## 2022-11-25 DIAGNOSIS — E1165 Type 2 diabetes mellitus with hyperglycemia: Secondary | ICD-10-CM

## 2022-11-25 DIAGNOSIS — E1159 Type 2 diabetes mellitus with other circulatory complications: Secondary | ICD-10-CM

## 2022-11-30 ENCOUNTER — Other Ambulatory Visit: Payer: Self-pay | Admitting: Family

## 2022-11-30 DIAGNOSIS — E1165 Type 2 diabetes mellitus with hyperglycemia: Secondary | ICD-10-CM

## 2022-11-30 DIAGNOSIS — E1159 Type 2 diabetes mellitus with other circulatory complications: Secondary | ICD-10-CM

## 2022-12-04 ENCOUNTER — Ambulatory Visit (HOSPITAL_COMMUNITY)
Admission: EM | Admit: 2022-12-04 | Discharge: 2022-12-04 | Disposition: A | Discharge status: Home / Self Care | Payer: Medicare Other | Attending: Physician Assistant | Admitting: Physician Assistant

## 2022-12-04 ENCOUNTER — Encounter (HOSPITAL_COMMUNITY): Payer: Self-pay

## 2022-12-04 DIAGNOSIS — B880 Other acariasis: Secondary | ICD-10-CM | POA: Diagnosis not present

## 2022-12-04 DIAGNOSIS — R21 Rash and other nonspecific skin eruption: Secondary | ICD-10-CM

## 2022-12-04 MED ORDER — TRIAMCINOLONE ACETONIDE 0.1 % EX CREA: 1.0000 | TOPICAL_CREAM | Freq: Two times a day (BID) | CUTANEOUS | 0 refills | Status: DC

## 2022-12-04 NOTE — ED Triage Notes (Signed)
Pt presents with red itchy bumps to both ankles. That started 3 days ago. Tried hydrocortisone cream with no relief.

## 2022-12-04 NOTE — ED Provider Notes (Signed)
[facility]    CSN: [id number] Arrival date & time: 12/04/22  1914      History   Chief Complaint Chief Complaint  Patient presents with   Rash    HPI Brian Marks is a 72 y.o. male.   Patient presents today with a several day history of pruritic papular rash involving his ankles and a few scattered areas on his legs.  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  Denies any changes to his personal hygiene products including soaps or detergents.  He has applied hydrocortisone cream with improvement of symptoms.  Denies any spread of rash.  Denies any associated symptoms including fever, nausea, vomiting.  He denies history of dermatological condition including eczema or psoriasis.    Past Medical History:  Diagnosis Date   Cancer [facility]) 2008   Prostate   Screening for malignant neoplasm of colon 11/11/2021    Patient Active Problem List   Diagnosis Date Noted   Bleeding per rectum 11/11/2021   Change in bowel habit 11/11/2021   Personal history of colonic polyps 11/11/2021   Prostate cancer (HCC) 11/11/2021   Uncontrolled diabetes mellitus with hyperglycemia, without long-term current use of insulin (HCC) 06/12/2021   Hypertension associated with diabetes (HCC) 06/12/2021   Hyperlipidemia associated with type 2 diabetes mellitus (HCC) 06/12/2021   Acquired hypothyroidism 06/12/2021   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 Authorizing Provider  Accu-Chek Softclix Lancets lancets E11.65 check glucose TID and PRN 05/14/21  Yes Bing Neighbors, NP  aspirin 81 MG tablet Take 81 mg by mouth daily.      Yes [provider]  glimepiride (AMARYL) 2 MG tablet TAKE 2 TABLETS BY MOUTH ONCE DAILY IN THE MORNING WITH BREAKFAST AND 1 IN THE EVENING WITH SUPPER 11/30/22  Yes Hudnell, Judeth Cornfield, NP  glucose blood (ACCU-CHEK AVIVA PLUS) test strip E11.65 Check glucose TID and PRN 11/11/21  Yes Hudnell, Judeth Cornfield, NP  hydrochlorothiazide (HYDRODIURIL) 25 MG tablet Take 1 tablet by mouth once daily 11/30/22  Yes Hudnell, Stephanie, NP  levothyroxine (SYNTHROID) 125 MCG tablet TAKE 1 TABLET BY MOUTH BEFORE BREAKFAST 08/19/22  Yes Hudnell, Judeth Cornfield, NP  losartan (COZAAR) 50 MG tablet Take 1 tablet by mouth once daily 11/23/22  Yes Hudnell, Stephanie, NP  metFORMIN (GLUCOPHAGE-XR) 500 MG 24 hr tablet TAKE 2 TABLETS BY MOUTH TWICE DAILY AFTER A MEAL---DISCONTINUE SYNJARDY 08/19/22  Yes Hudnell, Judeth Cornfield, NP  rosuvastatin (CRESTOR) 5 MG tablet Take 1 tablet by mouth once daily 11/23/22  Yes Hudnell, Stephanie, NP  tadalafil (CIALIS) 20 MG tablet Take 20 mg by mouth daily as needed.   Yes [provider]  triamcinolone cream (KENALOG) 0.1 % Apply 1 Application topically 2 (two) times daily. 12/04/22  Yes Ivis Henneman, Noberto Retort, PA-C    Family History Family History  Problem Relation Age of Onset   Kidney disease Mother    Hyperlipidemia Other    Hypertension Other     Social History Social History   Tobacco Use   Smoking status: Never   Smokeless tobacco: Never  Vaping Use   Vaping status: Never Used  Substance Use Topics   Alcohol use: No   Drug use: No     Allergies   Patient has no known allergies.   Review of Systems Review of Systems  Constitutional:  Negative for activity change, appetite change, fatigue and fever.  Gastrointestinal:  Negative for abdominal pain, diarrhea, nausea and vomiting.  Musculoskeletal:  Negative for arthralgias and myalgias.  Skin:  Positive for rash.  Neurological:  Negative for dizziness, light-headedness and headaches.     Physical Exam Triage Vital  Signs ED Triage Vitals [12/04/22 0925]  Encounter Vitals Group     BP (!) 147/87     Systolic BP Percentile      Diastolic BP Percentile      Pulse Rate 75     Resp 18     Temp 98.2 F (36.8 C)     Temp Source Oral     SpO2 95 %     Weight      Height      Head Circumference      Peak Flow      Pain Score 0     Pain Loc      Pain Education      Exclude from Growth Chart    No data found.  Updated Vital Signs BP (!) 147/87 (BP Location: Right Arm)   Pulse 75   Temp 98.2 F (36.8 C) (Oral)   Resp 18   SpO2 95%   Visual Acuity Right Eye Distance:   Left Eye Distance:   Bilateral Distance:    Right Eye Near:   Left Eye Near:    Bilateral Near:     Physical Exam Vitals reviewed.  Constitutional:      General: He is awake.     Appearance: Normal appearance. He is well-developed. He is not ill-appearing.     Comments: Very pleasant male appears stated age in no acute distress sitting comfortably in exam room  HENT:     Head: Normocephalic and atraumatic.     Mouth/Throat:     Pharynx: Uvula midline. No oropharyngeal exudate or posterior oropharyngeal erythema.  Cardiovascular:     Rate and Rhythm: Normal rate and regular rhythm.     Heart sounds: Normal heart sounds, S1 normal and S2 normal. No murmur heard. Pulmonary:     Effort: Pulmonary effort is normal.     Breath sounds: Normal breath sounds. No stridor. No wheezing, rhonchi or rales.     Comments: Clear to auscultation bilaterally Feet:     Right foot:     Protective Sensation: 10 sites tested.  10 sites sensed.     Skin integrity: No ulcer, blister or skin breakdown.     Toenail Condition: Right toenails are normal.     Left foot:     Protective Sensation: 10 sites tested.  10 sites sensed.     Skin integrity: No ulcer, blister or skin breakdown.     Toenail Condition: Left toenails are normal.     Comments: 1 cm x 1 cm erythematous macule noted left plantar foot.  Foot is neurovascularly intact.  No  additional lesions noted. Skin:    Findings: Rash present. Rash is papular.     Comments: Erythematous papules ranging in size from 2 mm to 6 mm scattered bilateral ankles.  No evidence of excoriation.  No bleeding or drainage noted.  No surrounding erythema or streaking.  Neurological:  Mental Status: He is alert.  Psychiatric:        Behavior: Behavior is cooperative.      UC Treatments / Results  Labs (all labs ordered are listed, but only abnormal results are displayed) Labs Reviewed - No data to display  EKG   Radiology No results found.  Procedures Procedures (including critical care time)  Medications Ordered in UC Medications - No data to display  Initial Impression / Assessment and Plan / UC Course  I have reviewed the triage vital signs and the nursing notes.  Pertinent labs & imaging results that were available during my care of the patient were reviewed by me and considered in my medical decision making (see chart for details).     Patient is well-appearing, afebrile, nontoxic, nontachycardic.  Given appearance and clinical presentation concern for chigger bites.  Patient was encouraged to keep area clean and avoid scratching as this increases risk of secondary infection particularly given his history of diabetes.  He was prescribed triamcinolone to be applied twice daily to help manage symptoms.  Recommended to use hypoallergenic soaps and detergents.  He was noted to have a erythematous macule on his left plantar foot.  Reports that this has been present for many years and has been evaluated by podiatry and thought to be benign.  I did offer him a syphilis testing the low suspicion for this given clinical presentation.  He reports that he has no concerns for syphilis and declined testing today.  Will consider if his symptoms do not resolve with conservative treatment measures.  We discussed that if symptoms are improving within a week he should return for  reevaluation.  If he has any worsening or changing symptoms strict return precautions given.  Final Clinical Impressions(s) / UC Diagnoses   Final diagnoses:  Chigger bites  Papular rash     Discharge Instructions      I am concerned for chigger bites given how your rash appears.  Keep this clean with soap and water.  Apply triamcinolone twice daily.  If this changes or spreads in any way please return for reevaluation.  Follow-up with your PCP.     ED Prescriptions     Medication Sig Dispense Auth. Provider   triamcinolone cream (KENALOG) 0.1 % Apply 1 Application topically 2 (two) times daily. 30 g Greg Cratty, Noberto Retort, PA-C      PDMP not reviewed this encounter.   Jeani Hawking, PA-C 12/04/22 [id number]

## 2022-12-04 NOTE — Discharge Instructions (Signed)
I am concerned for chigger bites given how your rash appears.  Keep this clean with soap and water.  Apply triamcinolone twice daily.  If this changes or spreads in any way please return for reevaluation.  Follow-up with your PCP.

## 2022-12-31 ENCOUNTER — Other Ambulatory Visit: Payer: Self-pay | Admitting: Family

## 2022-12-31 ENCOUNTER — Ambulatory Visit: Payer: Medicare Other

## 2022-12-31 VITALS — Wt 234.0 lb

## 2022-12-31 DIAGNOSIS — Z0000 Encounter for general adult medical examination without abnormal findings: Secondary | ICD-10-CM | POA: Diagnosis not present

## 2022-12-31 DIAGNOSIS — E1165 Type 2 diabetes mellitus with hyperglycemia: Secondary | ICD-10-CM

## 2022-12-31 NOTE — Patient Instructions (Signed)
Brian Marks , Thank you for taking time to come for your Medicare Wellness Visit. I appreciate your ongoing commitment to your health goals. Please review the following plan we discussed and let me know if I can assist you in the future.   Referrals/Orders/Follow-Ups/Clinician Recommendations: stay healthy  This is a list of the screening recommended for you and due dates:  Health Maintenance  Topic Date Due   Yearly kidney health urinalysis for diabetes  Never done   Hepatitis C Screening  Never done   DTaP/Tdap/Td vaccine (2 - Td or Tdap) 09/23/2014   Pneumonia Vaccine (2 of 2 - PCV) 11/16/2015   Zoster (Shingles) Vaccine (2 of 2) 05/26/2021   Eye exam for diabetics  07/16/2021   Hemoglobin A1C  06/04/2022   Yearly kidney function blood test for diabetes  11/12/2022   Flu Shot  12/24/2022   Complete foot exam   03/03/2023   Medicare Annual Wellness Visit  12/31/2023   Colon Cancer Screening  12/25/2029   HPV Vaccine  Aged Out   COVID-19 Vaccine  Discontinued    Advanced directives: (Declined) Advance directive discussed with you today. Even though you declined this today, please call our office should you change your mind, and we can give you the proper paperwork for you to fill out.  Next Medicare Annual Wellness Visit scheduled for next year: Yes  Preventive Care 20 Years and Older, Male  Preventive care refers to lifestyle choices and visits with your health care provider that can promote health and wellness. What does preventive care include? A yearly physical exam. This is also called an annual well check. Dental exams once or twice a year. Routine eye exams. Ask your health care provider how often you should have your eyes checked. Personal lifestyle choices, including: Daily care of your teeth and gums. Regular physical activity. Eating a healthy diet. Avoiding tobacco and drug use. Limiting alcohol use. Practicing safe sex. Taking low doses of aspirin every  day. Taking vitamin and mineral supplements as recommended by your health care provider. What happens during an annual well check? The services and screenings done by your health care provider during your annual well check will depend on your age, overall health, lifestyle risk factors, and family history of disease. Counseling  Your health care provider may ask you questions about your: Alcohol use. Tobacco use. Drug use. Emotional well-being. Home and relationship well-being. Sexual activity. Eating habits. History of falls. Memory and ability to understand (cognition). Work and work Astronomer. Screening  You may have the following tests or measurements: Height, weight, and BMI. Blood pressure. Lipid and cholesterol levels. These may be checked every 5 years, or more frequently if you are over 5 years old. Skin check. Lung cancer screening. You may have this screening every year starting at age 62 if you have a 30-pack-year history of smoking and currently smoke or have quit within the past 15 years. Fecal occult blood test (FOBT) of the stool. You may have this test every year starting at age 44. Flexible sigmoidoscopy or colonoscopy. You may have a sigmoidoscopy every 5 years or a colonoscopy every 10 years starting at age 64. Prostate cancer screening. Recommendations will vary depending on your family history and other risks. Hepatitis C blood test. Hepatitis B blood test. Sexually transmitted disease (STD) testing. Diabetes screening. This is done by checking your blood sugar (glucose) after you have not eaten for a while (fasting). You may have this done every 1-3 years. Abdominal  aortic aneurysm (AAA) screening. You may need this if you are a current or former smoker. Osteoporosis. You may be screened starting at age 109 if you are at high risk. Talk with your health care provider about your test results, treatment options, and if necessary, the need for more  tests. Vaccines  Your health care provider may recommend certain vaccines, such as: Influenza vaccine. This is recommended every year. Tetanus, diphtheria, and acellular pertussis (Tdap, Td) vaccine. You may need a Td booster every 10 years. Zoster vaccine. You may need this after age 75. Pneumococcal 13-valent conjugate (PCV13) vaccine. One dose is recommended after age 96. Pneumococcal polysaccharide (PPSV23) vaccine. One dose is recommended after age 47. Talk to your health care provider about which screenings and vaccines you need and how often you need them. This information is not intended to replace advice given to you by your health care provider. Make sure you discuss any questions you have with your health care provider. Document Released: 06/07/2015 Document Revised: 01/29/2016 Document Reviewed: 03/12/2015 Elsevier Interactive Patient Education  2017 [employer].  Fall Prevention in the Home Falls can cause injuries. They can happen to people of all ages. There are many things you can do to make your home safe and to help prevent falls. What can I do on the outside of my home? Regularly fix the edges of walkways and driveways and fix any cracks. Remove anything that might make you trip as you walk through a door, such as a raised step or threshold. Trim any bushes or trees on the path to your home. Use bright outdoor lighting. Clear any walking paths of anything that might make someone trip, such as rocks or tools. Regularly check to see if handrails are loose or broken. Make sure that both sides of any steps have handrails. Any raised decks and porches should have guardrails on the edges. Have any leaves, snow, or ice cleared regularly. Use sand or salt on walking paths during winter. Clean up any spills in your garage right away. This includes oil or grease spills. What can I do in the bathroom? Use night lights. Install grab bars by the toilet and in the tub and shower.  Do not use towel bars as grab bars. Use non-skid mats or decals in the tub or shower. If you need to sit down in the shower, use a plastic, non-slip stool. Keep the floor dry. Clean up any water that spills on the floor as soon as it happens. Remove soap buildup in the tub or shower regularly. Attach bath mats securely with double-sided non-slip rug tape. Do not have throw rugs and other things on the floor that can make you trip. What can I do in the bedroom? Use night lights. Make sure that you have a light by your bed that is easy to reach. Do not use any sheets or blankets that are too big for your bed. They should not hang down onto the floor. Have a firm chair that has side arms. You can use this for support while you get dressed. Do not have throw rugs and other things on the floor that can make you trip. What can I do in the kitchen? Clean up any spills right away. Avoid walking on wet floors. Keep items that you use a lot in easy-to-reach places. If you need to reach something above you, use a strong step stool that has a grab bar. Keep electrical cords out of the way. Do not use  floor polish or wax that makes floors slippery. If you must use wax, use non-skid floor wax. Do not have throw rugs and other things on the floor that can make you trip. What can I do with my stairs? Do not leave any items on the stairs. Make sure that there are handrails on both sides of the stairs and use them. Fix handrails that are broken or loose. Make sure that handrails are as long as the stairways. Check any carpeting to make sure that it is firmly attached to the stairs. Fix any carpet that is loose or worn. Avoid having throw rugs at the top or bottom of the stairs. If you do have throw rugs, attach them to the floor with carpet tape. Make sure that you have a light switch at the top of the stairs and the bottom of the stairs. If you do not have them, ask someone to add them for you. What else  can I do to help prevent falls? Wear shoes that: Do not have high heels. Have rubber bottoms. Are comfortable and fit you well. Are closed at the toe. Do not wear sandals. If you use a stepladder: Make sure that it is fully opened. Do not climb a closed stepladder. Make sure that both sides of the stepladder are locked into place. Ask someone to hold it for you, if possible. Clearly mark and make sure that you can see: Any grab bars or handrails. First and last steps. Where the edge of each step is. Use tools that help you move around (mobility aids) if they are needed. These include: Canes. Walkers. Scooters. Crutches. Turn on the lights when you go into a dark area. Replace any light bulbs as soon as they burn out. Set up your furniture so you have a clear path. Avoid moving your furniture around. If any of your floors are uneven, fix them. If there are any pets around you, be aware of where they are. Review your medicines with your doctor. Some medicines can make you feel dizzy. This can increase your chance of falling. Ask your doctor what other things that you can do to help prevent falls. This information is not intended to replace advice given to you by your health care provider. Make sure you discuss any questions you have with your health care provider. Document Released: 03/07/2009 Document Revised: 10/17/2015 Document Reviewed: 06/15/2014 Elsevier Interactive Patient Education  2017 [employer].

## 2022-12-31 NOTE — Progress Notes (Addendum)
Subjective:   Brian Marks is a 72 y.o. male who presents for Medicare Annual/Subsequent preventive examination.  Visit Complete: Virtual  I connected with  Brian Marks on 12/31/22 by a audio enabled telemedicine application and verified that I am speaking with the correct person using two identifiers.  Patient Location: Home  Provider Location: Office/Clinic  I discussed the limitations of evaluation and management by telemedicine. The patient expressed understanding and agreed to proceed.    Vital Signs: Unable to obtain new vitals due to this being a telehealth visit.   Review of Systems     Cardiac Risk Factors include: advanced age (>63men, >79 women);dyslipidemia;diabetes mellitus;hypertension;male gender;obesity (BMI >30kg/m2)     Objective:    Today's Vitals   12/31/22 1524  Weight: 234 lb (106.1 kg)   Body mass index is 30.04 kg/m.     12/31/2022    3:29 PM 12/16/2021   11:59 AM 11/30/2019    3:42 PM  Advanced Directives  Does Patient Have a Medical Advance Directive? No No No  Would patient like information on creating a medical advance directive? No - Patient declined Yes (MAU/Ambulatory/Procedural Areas - Information given) No - Patient declined    Current Medications (verified) Outpatient Encounter Medications as of 12/31/2022  Medication Sig   Accu-Chek Softclix Lancets lancets E11.65 check glucose TID and PRN   aspirin 81 MG tablet Take 81 mg by mouth daily.     glimepiride (AMARYL) 2 MG tablet TAKE 2 TABLETS BY MOUTH ONCE DAILY IN THE MORNING WITH BREAKFAST AND 1 IN THE EVENING WITH SUPPER   glucose blood (ACCU-CHEK AVIVA PLUS) test strip E11.65 Check glucose TID and PRN   hydrochlorothiazide (HYDRODIURIL) 25 MG tablet Take 1 tablet by mouth once daily   levothyroxine (SYNTHROID) 125 MCG tablet TAKE 1 TABLET BY MOUTH BEFORE BREAKFAST   losartan (COZAAR) 50 MG tablet Take 1 tablet by mouth once daily   metFORMIN (GLUCOPHAGE-XR) 500 MG 24 hr tablet  TAKE 2 TABLETS BY MOUTH TWICE DAILY AFTER A MEAL---DISCONTINUE SYNJARDY   rosuvastatin (CRESTOR) 5 MG tablet Take 1 tablet by mouth once daily   tadalafil (CIALIS) 20 MG tablet Take 20 mg by mouth daily as needed.   triamcinolone cream (KENALOG) 0.1 % Apply 1 Application topically 2 (two) times daily.   No facility-administered encounter medications on file as of 12/31/2022.    Allergies (verified) Patient has no known allergies.   History: Past Medical History:  Diagnosis Date   Cancer (HCC) 2008   Prostate   Screening for malignant neoplasm of colon 11/11/2021   Past Surgical History:  Procedure Laterality Date   PROSTATE SURGERY  2008   Family History  Problem Relation Age of Onset   Kidney disease Mother    Hyperlipidemia Other    Hypertension Other    Social History   Socioeconomic History   Marital status: Married    Spouse name: Not on file   Number of children: Not on file   Years of education: Not on file   Highest education level: Not on file  Occupational History   Not on file  Tobacco Use   Smoking status: Never   Smokeless tobacco: Never  Vaping Use   Vaping status: Never Used  Substance and Sexual Activity   Alcohol use: No   Drug use: No   Sexual activity: Not on file  Other Topics Concern   Not on file  Social History Narrative   Not on file  Social Determinants of Health   Financial Resource Strain: Low Risk  (12/31/2022)   Overall Financial Resource Strain (CARDIA)    Difficulty of Paying Living Expenses: Not hard at all  Food Insecurity: No Food Insecurity (12/31/2022)   Hunger Vital Sign    Worried About Running Out of Food in the Last Year: Never true    Ran Out of Food in the Last Year: Never true  Transportation Needs: No Transportation Needs (12/31/2022)   PRAPARE - Administrator, Civil Service (Medical): No    Lack of Transportation (Non-Medical): No  Physical Activity: Sufficiently Active (12/31/2022)   Exercise Vital Sign     Days of Exercise per Week: 7 days    Minutes of Exercise per Session: 150+ min  Stress: No Stress Concern Present (12/31/2022)   [employer] of Occupational Health - Occupational Stress Questionnaire    Feeling of Stress : Not at all  Social Connections: Moderately Integrated (12/31/2022)   Social Connection and Isolation Panel [NHANES]    Frequency of Communication with Friends and Family: More than three times a week    Frequency of Social Gatherings with Friends and Family: More than three times a week    Attends Religious Services: More than 4 times per year    Active Member of [employer] or Organizations: No    Attends Engineer, structural: Never    Marital Status: Married    Tobacco Counseling Counseling given: Not Answered   Clinical Intake:  Pre-visit preparation completed: Yes  Pain : No/denies pain     BMI - recorded: 30.04 Nutritional Status: BMI > 30  Obese Nutritional Risks: None Diabetes: Yes CBG done?: No Did pt. bring in CBG monitor from home?: No  How often do you need to have someone help you when you read instructions, pamphlets, or other written materials from your doctor or pharmacy?: 1 - Never  Interpreter Needed?: No  Information entered by :: Lanier Ensign, LPN   Activities of Daily Living    12/31/2022    3:27 PM  In your present state of health, do you have any difficulty performing the following activities:  Hearing? 0  Vision? 0  Difficulty concentrating or making decisions? 0  Walking or climbing stairs? 0  Dressing or bathing? 0  Doing errands, shopping? 0  Preparing Food and eating ? N  Using the Toilet? N  In the past six months, have you accidently leaked urine? Y  Comment at night and wears a pad at times  Do you have problems with loss of bowel control? N  Managing your Medications? N  Managing your Finances? N  Housekeeping or managing your Housekeeping? N    Patient Care Team: Dulce Sellar, NP as PCP -  General (Family Medicine)  Indicate any recent Medical Services you may have received from other than [facility] providers in the past year (date may be approximate).     Assessment:   This is a routine wellness examination for Brian Marks.  Hearing/Vision screen Hearing Screening - Comments:: Pt denies any hearing issues  Vision Screening - Comments:: Pt follows up with [facility] for annual eye exams   Dietary issues and exercise activities discussed:     Goals Addressed             This Visit's Progress    Patient Stated       Stay healthy       Depression Screen    12/31/2022    3:30  PM 12/16/2021   11:57 AM 06/12/2021    8:55 AM  PHQ 2/9 Scores  PHQ - 2 Score 0 0 0    Fall Risk    12/31/2022    3:32 PM 12/16/2021   12:01 PM 06/12/2021    8:55 AM  Fall Risk   Falls in the past year? 0 0 0  Number falls in past yr: 0 0   Injury with Fall? 0 0   Risk for fall due to : Impaired vision    Follow up Falls prevention discussed Falls prevention discussed     MEDICARE RISK AT HOME:  Medicare Risk at Home - 12/31/22 1532     Any stairs in or around the home? Yes    If so, are there any without handrails? No    Home free of loose throw rugs in walkways, pet beds, electrical cords, etc? Yes    Adequate lighting in your home to reduce risk of falls? Yes    Use of a cane, walker or w/c? No    Grab bars in the bathroom? No    Shower chair or bench in shower? No    Elevated toilet seat or a handicapped toilet? No             TIMED UP AND GO:  Was the test performed?  No    Cognitive Function:        12/31/2022    3:32 PM  6CIT Screen  What Year? 0 points  What month? 0 points  What time? 0 points  Count back from 20 0 points  Months in reverse 0 points  Repeat phrase 0 points  Total Score 0 points    Immunizations Immunization History  Administered Date(s) Administered   Fluad Quad(high Dose 65+) 03/11/2016, 04/08/2021, 03/04/2022   Influenza Inj Mdck  Quad Pf 03/18/2015   Influenza, High Dose Seasonal PF 04/17/2018, 03/16/2019   Pneumococcal Polysaccharide-23 11/30/2012   Tdap 09/22/2004   Zoster Recombinant(Shingrix) 03/31/2021    TDAP status: Due, Education has been provided regarding the importance of this vaccine. Advised may receive this vaccine at local pharmacy or Health Dept. Aware to provide a copy of the vaccination record if obtained from local pharmacy or Health Dept. Verbalized acceptance and understanding.  Flu Vaccine status: Due, Education has been provided regarding the importance of this vaccine. Advised may receive this vaccine at local pharmacy or Health Dept. Aware to provide a copy of the vaccination record if obtained from local pharmacy or Health Dept. Verbalized acceptance and understanding.  Pneumococcal vaccine status: Due, Education has been provided regarding the importance of this vaccine. Advised may receive this vaccine at local pharmacy or Health Dept. Aware to provide a copy of the vaccination record if obtained from local pharmacy or Health Dept. Verbalized acceptance and understanding.  Covid-19 vaccine status: Declined, Education has been provided regarding the importance of this vaccine but patient still declined. Advised may receive this vaccine at local pharmacy or Health Dept.or vaccine clinic. Aware to provide a copy of the vaccination record if obtained from local pharmacy or Health Dept. Verbalized acceptance and understanding.  Qualifies for Shingles Vaccine? Yes   Zostavax completed Yes   Shingrix Completed?: No.    Education has been provided regarding the importance of this vaccine. Patient has been advised to call insurance company to determine out of pocket expense if they have not yet received this vaccine. Advised may also receive vaccine at local pharmacy or Health Dept. Verbalized  acceptance and understanding.  Screening Tests Health Maintenance  Topic Date Due   Diabetic kidney  evaluation - Urine ACR  Never done   Hepatitis C Screening  Never done   DTaP/Tdap/Td (2 - Td or Tdap) 09/23/2014   Pneumonia Vaccine 48+ Years old (2 of 2 - PCV) 11/16/2015   Zoster Vaccines- Shingrix (2 of 2) 05/26/2021   OPHTHALMOLOGY EXAM  07/16/2021   HEMOGLOBIN A1C  06/04/2022   Diabetic kidney evaluation - eGFR measurement  11/12/2022   INFLUENZA VACCINE  12/24/2022   FOOT EXAM  03/03/2023   Medicare Annual Wellness (AWV)  12/31/2023   Colonoscopy  12/25/2029   HPV VACCINES  Aged Out   COVID-19 Vaccine  Discontinued    Health Maintenance  Health Maintenance Due  Topic Date Due   Diabetic kidney evaluation - Urine ACR  Never done   Hepatitis C Screening  Never done   DTaP/Tdap/Td (2 - Td or Tdap) 09/23/2014   Pneumonia Vaccine 66+ Years old (2 of 2 - PCV) 11/16/2015   Zoster Vaccines- Shingrix (2 of 2) 05/26/2021   OPHTHALMOLOGY EXAM  07/16/2021   HEMOGLOBIN A1C  06/04/2022   Diabetic kidney evaluation - eGFR measurement  11/12/2022   INFLUENZA VACCINE  12/24/2022    Colorectal cancer screening: Type of screening: Colonoscopy. Completed 12/26/19. Repeat every 10 years  Additional Screening:  Hepatitis C Screening: does qualify;  Vision Screening: Recommended annual ophthalmology exams for early detection of glaucoma and other disorders of the eye. Is the patient up to date with their annual eye exam?  Yes  Who is the provider or what is the name of the office in which the patient attends annual eye exams? [facility]  If pt is not established with a provider, would they like to be referred to a provider to establish care? No .   Dental Screening: Recommended annual dental exams for proper oral hygiene  Diabetic Foot Exam: Diabetic Foot Exam: Completed 03/02/22  Community Resource Referral / Chronic Care Management: CRR required this visit?  No   CCM required this visit?  No     Plan:     I have personally reviewed and noted the following in the patient's  chart:   Medical and social history Use of alcohol, tobacco or illicit drugs  Current medications and supplements including opioid prescriptions. Patient is not currently taking opioid prescriptions. Functional ability and status Nutritional status Physical activity Advanced directives List of other physicians Hospitalizations, surgeries, and ER visits in previous 12 months Vitals Screenings to include cognitive, depression, and falls Referrals and appointments  In addition, I have reviewed and discussed with patient certain preventive protocols, quality metrics, and best practice recommendations. A written personalized care plan for preventive services as well as general preventive health recommendations were provided to patient.     Marzella Schlein, LPN   12/29/5641   After Visit Summary: (MyChart) Due to this being a telephonic visit, the after visit summary with patients personalized plan was offered to patient via MyChart   Nurse Notes: Pt request refill on metformin 500 mg to [facility]

## 2023-01-17 ENCOUNTER — Other Ambulatory Visit: Payer: Self-pay | Admitting: Family

## 2023-01-17 DIAGNOSIS — E039 Hypothyroidism, unspecified: Secondary | ICD-10-CM

## 2023-02-24 ENCOUNTER — Other Ambulatory Visit: Payer: Self-pay | Admitting: Family

## 2023-02-24 DIAGNOSIS — I152 Hypertension secondary to endocrine disorders: Secondary | ICD-10-CM

## 2023-02-24 DIAGNOSIS — E1169 Type 2 diabetes mellitus with other specified complication: Secondary | ICD-10-CM

## 2023-03-01 ENCOUNTER — Other Ambulatory Visit: Payer: Medicare Other

## 2023-03-01 ENCOUNTER — Ambulatory Visit (INDEPENDENT_AMBULATORY_CARE_PROVIDER_SITE_OTHER): Payer: Medicare Other | Admitting: Family

## 2023-03-01 VITALS — BP 136/80 | HR 75 | Temp 98.1°F | Ht 74.0 in | Wt 234.4 lb

## 2023-03-01 DIAGNOSIS — E1159 Type 2 diabetes mellitus with other circulatory complications: Secondary | ICD-10-CM | POA: Diagnosis not present

## 2023-03-01 DIAGNOSIS — E1122 Type 2 diabetes mellitus with diabetic chronic kidney disease: Secondary | ICD-10-CM | POA: Diagnosis not present

## 2023-03-01 DIAGNOSIS — E785 Hyperlipidemia, unspecified: Secondary | ICD-10-CM | POA: Diagnosis not present

## 2023-03-01 DIAGNOSIS — E1169 Type 2 diabetes mellitus with other specified complication: Secondary | ICD-10-CM

## 2023-03-01 DIAGNOSIS — Z1159 Encounter for screening for other viral diseases: Secondary | ICD-10-CM

## 2023-03-01 DIAGNOSIS — Z23 Encounter for immunization: Secondary | ICD-10-CM | POA: Diagnosis not present

## 2023-03-01 DIAGNOSIS — E1165 Type 2 diabetes mellitus with hyperglycemia: Secondary | ICD-10-CM

## 2023-03-01 DIAGNOSIS — N182 Chronic kidney disease, stage 2 (mild): Secondary | ICD-10-CM

## 2023-03-01 DIAGNOSIS — Z7984 Long term (current) use of oral hypoglycemic drugs: Secondary | ICD-10-CM

## 2023-03-01 DIAGNOSIS — E039 Hypothyroidism, unspecified: Secondary | ICD-10-CM

## 2023-03-01 DIAGNOSIS — I152 Hypertension secondary to endocrine disorders: Secondary | ICD-10-CM

## 2023-03-01 LAB — COMPREHENSIVE METABOLIC PANEL
ALT: 16 U/L (ref 0–53)
AST: 21 U/L (ref 0–37)
Albumin: 4.2 g/dL (ref 3.5–5.2)
Alkaline Phosphatase: 49 U/L (ref 39–117)
BUN: 26 mg/dL — ABNORMAL HIGH (ref 6–23)
CO2: 28 meq/L (ref 19–32)
Calcium: 9.9 mg/dL (ref 8.4–10.5)
Chloride: 100 meq/L (ref 96–112)
Creatinine, Ser: 1.32 mg/dL (ref 0.40–1.50)
GFR: 53.97 mL/min — ABNORMAL LOW (ref >60.00)
Glucose, Bld: 142 mg/dL — ABNORMAL HIGH (ref 70–99)
Potassium: 4.5 meq/L (ref 3.5–5.1)
Sodium: 136 meq/L (ref 135–145)
Total Bilirubin: 1 mg/dL (ref 0.2–1.2)
Total Protein: 7.4 g/dL (ref 6.0–8.3)

## 2023-03-01 LAB — CBC WITH DIFFERENTIAL/PLATELET
Basophils Absolute: 0 10*3/uL (ref 0.0–0.1)
Basophils Relative: 0.9 % (ref 0.0–3.0)
Eosinophils Absolute: 0.2 10*3/uL (ref 0.0–0.7)
Eosinophils Relative: 4 % (ref 0.0–5.0)
HCT: 43.9 % (ref 39.0–52.0)
Hemoglobin: 14.5 g/dL (ref 13.0–17.0)
Lymphocytes Relative: 32 % (ref 12.0–46.0)
Lymphs Abs: 1.3 10*3/uL (ref 0.7–4.0)
MCHC: 33.1 g/dL (ref 30.0–36.0)
MCV: 89 fL (ref 78.0–100.0)
Monocytes Absolute: 0.2 10*3/uL (ref 0.1–1.0)
Monocytes Relative: 4.8 % (ref 3.0–12.0)
Neutro Abs: 2.3 10*3/uL (ref 1.4–7.7)
Neutrophils Relative %: 58.3 % (ref 43.0–77.0)
Platelets: 180 10*3/uL (ref 150.0–400.0)
RBC: 4.93 Mil/uL (ref 4.22–5.81)
RDW: 13.6 % (ref 11.5–15.5)
WBC: 4 10*3/uL (ref 4.0–10.5)

## 2023-03-01 LAB — LIPID PANEL
Cholesterol: 96 mg/dL (ref 0–200)
HDL: 44.6 mg/dL (ref >39.00)
LDL Cholesterol: 42 mg/dL (ref 0–99)
NonHDL: 51.66
Total CHOL/HDL Ratio: 2
Triglycerides: 47 mg/dL (ref 0.0–149.0)
VLDL: 9.4 mg/dL (ref 0.0–40.0)

## 2023-03-01 LAB — TSH: TSH: 2.25 u[IU]/mL (ref 0.35–5.50)

## 2023-03-01 MED ORDER — JANUMET 50-1000 MG PO TABS
1.0000 | ORAL_TABLET | Freq: Two times a day (BID) | ORAL | 5 refills | Status: DC
Start: 2023-03-01 — End: 2023-04-05

## 2023-03-01 MED ORDER — HYDROCHLOROTHIAZIDE 25 MG PO TABS
25.0000 mg | ORAL_TABLET | Freq: Every day | ORAL | 1 refills | Status: DC
Start: 2023-03-01 — End: 2023-08-18

## 2023-03-01 MED ORDER — LOSARTAN POTASSIUM 50 MG PO TABS: 50.0000 mg | ORAL_TABLET | Freq: Every day | ORAL | 1 refills | Status: DC

## 2023-03-01 NOTE — Progress Notes (Signed)
Patient ID: Brian Marks, male    DOB: [birth year], 72 y.o.   MRN: [id number]  Chief Complaint  Patient presents with   Hypertension   Hyperlipidemia   Diabetes   Hypothyroidism    HPI: Hypothyroidism: Patient presents today for followup of Hypothyroidism.  Patient reports positive compliance with daily medication.  Patient denies any of the following symptoms: fatigue, cold intolerance, constipation, weight gain or inability to lose weight, muscle weakness, mental slowing, dry hair and skin. Last TSH and free T4: Lab Results  Component Value Date   FREE T4 1.21 08/11/2021   TSH 4.28 08/11/2021   TSH 36.986 (H) 05/14/2021    T2DM with CKD stage 2: Pt is currently maintained on the following medications for diabetes: Metformin 500mg  bid, Glimeperide 6mg  daily. Last GFR 44 - pt did not f/u - may need to d/c Metformin Failed meds include: none.Denies polyuria/polydipsia/polyphagia Denies hypoglycemia Home glucose readings range: pt has not been checking. Last A1C was  Lab Results  Component Value Date   HGBA1C 8.9 (A) 03/04/2022   HGBA1C 9.8 (A) 11/11/2021   HGBA1C 8.5 (A) 08/11/2021   Hypertension: Patient is currently maintained on the following medications for blood pressure: Losartan 50, HCTZ 25 daily. Patient reports good compliance with blood pressure medications. Patient denies chest pain, headaches, shortness of breath or swelling. Last 3 blood pressure readings in our office are as follows: BP Readings from Last 3 Encounters:  03/01/23 136/80  12/04/22 (!) 147/87  06/03/22 (!) 144/77    Hyperlipidemia: Patient is currently maintained on the following medication for hyperlipidemia: Crestor 5mg . Patient denies myalgia or other side effects. Patient reports good compliance with low fat/low cholesterol diet.  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 Last A1c was 8.9 a year ago, GFR 44, pt did not f/u when advised. Discussed the possibility of switching back to Janumet if it is now more affordable for the patient, but may need to cut Metformin down. -Check A1c today. -Continue Glimepiride 4mg  in the morning and 2mg  at night until lab results are back. -Switching to Janumet if it is more affordable for the patient, pt will let me know, understands to stop Metformin separately, but only if GFR back up. -F/U in 6 mo  Hypertension - Chronic, stable.  Patient ran out of Losartan and Hydrochlorothiazide. -Check CMP -Refill Losartan and Hydrochlorothiazide. -F/U in 6 mo  Hyperlipidemia - Patient is taking Rosuvastatin 5mg  and has plenty left. -Check lipids today. -Continue Rosuvastatin 5mg . -F/U in 6 mo- 82yr  Hypothyroidism -  Taking Levothyroxine daily. -recheck TSH today -no refill needed today f/u 3yr  General Health Maintenance -Administer high-dose influenza vaccine today.     Need for hepatitis C screening test -     Hepatitis C antibody   Subjective:    Outpatient Medications Prior to Visit  Medication Sig Dispense Refill   Accu-Chek Softclix Lancets lancets E11.65 check glucose TID and PRN 100 each 0   aspirin 81 MG tablet Take 81 mg by mouth daily.       glimepiride (AMARYL) 2 MG tablet TAKE 2 TABLETS BY MOUTH ONCE DAILY IN THE MORNING WITH BREAKFAST AND 1 IN  THE EVENING WITH SUPPER 270 tablet 0   glucose blood (ACCU-CHEK AVIVA PLUS) test strip E11.65 Check glucose TID and PRN 100 each 0   levothyroxine (SYNTHROID) 125 MCG tablet TAKE 1 TABLET BY MOUTH BEFORE BREAKFAST 90 tablet 0   metFORMIN (GLUCOPHAGE-XR) 500 MG 24 hr tablet TAKE 2 TABLETS BY MOUTH TWICE DAILY AFTER A MEAL (  STOP  SYNJARDY) 360 tablet 0   rosuvastatin (CRESTOR) 5 MG tablet Take 1 tablet by mouth once daily 90 tablet 0    tadalafil (CIALIS) 20 MG tablet Take 20 mg by mouth daily as needed.     triamcinolone cream (KENALOG) 0.1 % Apply 1 Application topically 2 (two) times daily. 30 g 0   hydrochlorothiazide (HYDRODIURIL) 25 MG tablet Take 1 tablet by mouth once daily 90 tablet 0   losartan (COZAAR) 50 MG tablet Take 1 tablet by mouth once daily 90 tablet 0   No facility-administered medications prior to visit.   Past Medical History:  Diagnosis Date   Cancer [facility]) 2008   Prostate   Screening for malignant neoplasm of colon 11/11/2021   Past Surgical History:  Procedure Laterality Date   PROSTATE SURGERY  2008   No Known Allergies    Objective:    Physical Exam Vitals and nursing note reviewed.  Constitutional:      General: He is not in acute distress.    Appearance: Normal appearance.  HENT:     Head: Normocephalic.  Cardiovascular:     Rate and Rhythm: Normal rate and regular rhythm.  Pulmonary:     Effort: Pulmonary effort is normal.     Breath sounds: Normal breath sounds.  Musculoskeletal:        General: Normal range of motion.     Cervical back: Normal range of motion.  Skin:    General: Skin is warm and dry.  Neurological:     Mental Status: He is alert and oriented to person, place, and time.  Psychiatric:        Mood and Affect: Mood normal.    BP 136/80   Pulse 75   Temp 98.1 F (36.7 C) (Temporal)   Ht 6\' 2"  (1.88 m)   Wt 234 lb 6 oz (106.3 kg)   SpO2 99%   BMI 30.09 kg/m  Wt Readings from Last 3 Encounters:  03/01/23 234 lb 6 oz (106.3 kg)  12/31/22 234 lb (106.1 kg)  03/04/22 234 lb (106.1 kg)       Dulce Sellar, NP

## 2023-03-01 NOTE — Addendum Note (Signed)
Addended byDulce Sellar on: 03/01/2023 03:38 PM   Modules accepted: Orders

## 2023-03-01 NOTE — Patient Instructions (Addendum)
It was very nice to see you today!   I will review your lab results via MyChart in a few days.  Keep taking your meds as directed. I have sent in refills & I will send the Glimeperide tomorrow.  Schedule a 6 month follow up visit today.     PLEASE NOTE:  If you had any lab tests please let us know if you have not heard back within a few days. You may see your results on MyChart before we have a chance to review them but we will give you a call once they are reviewed by [place]. If we ordered any referrals today, please let us know if you have not heard from their office within the next week.

## 2023-03-01 NOTE — Assessment & Plan Note (Signed)
   chronic  last GFR 44 in 07/2021   started on Synjardy - however pt paying $300 for 30d, d/t donut hole - pt took for 3 months, discontinuing today  waiting to see if local senior resource center can assist w/cost  continue to advised on water hydration, no NSAIDs

## 2023-03-02 LAB — HEMOGLOBIN A1C
Hgb A1c MFr Bld: 9.3 %{Hb} — ABNORMAL HIGH (ref <5.7)
Mean Plasma Glucose: 220 mg/dL
eAG (mmol/L): 12.2 mmol/L

## 2023-03-02 LAB — HEPATITIS C ANTIBODY: Hepatitis C Ab: NONREACTIVE

## 2023-03-04 ENCOUNTER — Encounter: Payer: Self-pay | Admitting: *Deleted

## 2023-03-04 ENCOUNTER — Other Ambulatory Visit: Payer: Self-pay | Admitting: Family

## 2023-03-04 DIAGNOSIS — E1165 Type 2 diabetes mellitus with hyperglycemia: Secondary | ICD-10-CM

## 2023-03-04 MED ORDER — GLIMEPIRIDE 4 MG PO TABS
4.0000 mg | ORAL_TABLET | Freq: Two times a day (BID) | ORAL | 1 refills | Status: DC
Start: 2023-03-04 — End: 2023-08-18

## 2023-03-04 NOTE — Progress Notes (Signed)
This patient is due for a urine Albumin/Creatinine ratio lab test. Please consider ordering this lab to assist in closing the KED gap

## 2023-03-04 NOTE — Progress Notes (Deleted)
This patient is due for a urine Albumin/Creatinine ratio lab test. Please consider ordering this lab to assist in closing the KED gap

## 2023-03-16 ENCOUNTER — Other Ambulatory Visit: Payer: Self-pay | Admitting: Family

## 2023-03-16 DIAGNOSIS — E1169 Type 2 diabetes mellitus with other specified complication: Secondary | ICD-10-CM

## 2023-04-05 ENCOUNTER — Other Ambulatory Visit: Payer: Self-pay | Admitting: Family

## 2023-04-05 DIAGNOSIS — E1165 Type 2 diabetes mellitus with hyperglycemia: Secondary | ICD-10-CM

## 2023-05-03 ENCOUNTER — Other Ambulatory Visit: Payer: Self-pay | Admitting: Family

## 2023-05-03 DIAGNOSIS — E039 Hypothyroidism, unspecified: Secondary | ICD-10-CM

## 2023-05-07 ENCOUNTER — Encounter: Payer: Self-pay | Admitting: Family

## 2023-05-11 NOTE — Progress Notes (Signed)
please close this care gap, thx

## 2023-06-18 ENCOUNTER — Other Ambulatory Visit: Payer: Self-pay | Admitting: Family

## 2023-06-18 DIAGNOSIS — E1169 Type 2 diabetes mellitus with other specified complication: Secondary | ICD-10-CM

## 2023-07-06 ENCOUNTER — Telehealth: Payer: Self-pay

## 2023-07-06 NOTE — Telephone Encounter (Signed)
Patient was identified as falling into the [place] Measure - Diabetes.   Patient was: Left voicemail to schedule with primary care provider.

## 2023-08-03 ENCOUNTER — Other Ambulatory Visit: Payer: Self-pay | Admitting: Family

## 2023-08-03 DIAGNOSIS — E039 Hypothyroidism, unspecified: Secondary | ICD-10-CM

## 2023-08-06 ENCOUNTER — Other Ambulatory Visit: Payer: Self-pay

## 2023-08-06 ENCOUNTER — Telehealth: Payer: Self-pay

## 2023-08-06 DIAGNOSIS — E1165 Type 2 diabetes mellitus with hyperglycemia: Secondary | ICD-10-CM

## 2023-08-06 NOTE — Telephone Encounter (Signed)
 Patient was identified as falling into the [place] Measure - Diabetes.   Patient was: LVM for pt to schedule lab only appt for A1C check; future lab orders placed

## 2023-08-13 ENCOUNTER — Other Ambulatory Visit

## 2023-08-13 DIAGNOSIS — E1165 Type 2 diabetes mellitus with hyperglycemia: Secondary | ICD-10-CM

## 2023-08-13 LAB — HEMOGLOBIN A1C: Hgb A1c MFr Bld: 12 % — ABNORMAL HIGH (ref 4.6–6.5)

## 2023-08-16 NOTE — Progress Notes (Signed)
 So I don't like this ordered just to close a care gap! Is this a new process? Could have waited till his visit next month, but since this is so high he should come in asap. Thx

## 2023-08-17 ENCOUNTER — Other Ambulatory Visit: Payer: Self-pay

## 2023-08-17 DIAGNOSIS — E1165 Type 2 diabetes mellitus with hyperglycemia: Secondary | ICD-10-CM

## 2023-08-17 MED ORDER — ACCU-CHEK AVIVA PLUS VI STRP: ORAL_STRIP | 0 refills | Status: DC

## 2023-08-17 MED ORDER — ACCU-CHEK SOFTCLIX LANCETS MISC: 0 refills | Status: DC

## 2023-08-18 ENCOUNTER — Telehealth: Payer: Self-pay

## 2023-08-18 ENCOUNTER — Encounter: Payer: Self-pay | Admitting: Family

## 2023-08-18 ENCOUNTER — Ambulatory Visit (INDEPENDENT_AMBULATORY_CARE_PROVIDER_SITE_OTHER): Admitting: Family

## 2023-08-18 ENCOUNTER — Other Ambulatory Visit: Payer: Self-pay | Admitting: Family

## 2023-08-18 DIAGNOSIS — E1159 Type 2 diabetes mellitus with other circulatory complications: Secondary | ICD-10-CM

## 2023-08-18 DIAGNOSIS — Z7984 Long term (current) use of oral hypoglycemic drugs: Secondary | ICD-10-CM | POA: Diagnosis not present

## 2023-08-18 DIAGNOSIS — I152 Hypertension secondary to endocrine disorders: Secondary | ICD-10-CM

## 2023-08-18 DIAGNOSIS — E1169 Type 2 diabetes mellitus with other specified complication: Secondary | ICD-10-CM | POA: Diagnosis not present

## 2023-08-18 DIAGNOSIS — E785 Hyperlipidemia, unspecified: Secondary | ICD-10-CM | POA: Diagnosis not present

## 2023-08-18 DIAGNOSIS — E1165 Type 2 diabetes mellitus with hyperglycemia: Secondary | ICD-10-CM

## 2023-08-18 MED ORDER — HYDROCHLOROTHIAZIDE 25 MG PO TABS: 25.0000 mg | ORAL_TABLET | Freq: Every day | ORAL | 1 refills | Status: DC

## 2023-08-18 MED ORDER — GLIPIZIDE ER 10 MG PO TB24: 10.0000 mg | ORAL_TABLET | Freq: Every day | ORAL | 1 refills | Status: DC

## 2023-08-18 MED ORDER — EMPAGLIFLOZIN 10 MG PO TABS: 10.0000 mg | ORAL_TABLET | Freq: Every day | ORAL | 2 refills | Status: DC

## 2023-08-18 MED ORDER — ROSUVASTATIN CALCIUM 5 MG PO TABS: 5.0000 mg | ORAL_TABLET | Freq: Every day | ORAL | 1 refills | Status: DC

## 2023-08-18 MED ORDER — LOSARTAN POTASSIUM 50 MG PO TABS: 50.0000 mg | ORAL_TABLET | Freq: Two times a day (BID) | ORAL | 1 refills | Status: DC

## 2023-08-18 NOTE — Progress Notes (Signed)
 Patient ID: Brian Marks, male    DOB: [birth year], 73 y.o.   MRN: [id number]  Chief Complaint  Patient presents with  . Uncontrolled diabetes mellitus with hyperglycemia, without     Pt A1c was high at last visit.           Discussed the use of AI scribe software for clinical note transcription with the patient, who gave verbal consent to proceed.  History of Present Illness The patient, with a history of diabetes and hypertension, presents with poorly controlled blood sugar levels and high blood pressure. The patient's recent A1c was significantly elevated, indicating poor glycemic control over the past few months. The patient has been on metformin and Janumet for diabetes management but has been non-compliant with the Janumet due to its cost. The patient requests a few more days to try and manage his blood sugar levels with lifestyle changes, including diet and exercise. The patient's blood pressure is also elevated despite being on hydrochlorothiazide. The patient's weight has increased by about ten pounds, which could be contributing to the poor control of his diabetes and high blood pressure.  Assessment & Plan Uncontrolled Type 2 Diabetes Mellitus - A1c at 12.1 indicates poor control. Gained 10 pounds. Current treatment includes metformin and glimepiride. Janumet not taken due to cost. Jardiance preferred for weight loss and kidney protection. Discussed cost issues and assistance programs. Emphasized blood glucose monitoring and lifestyle changes. Pt refusing to start insulin, despite recommendation. - Prescribe Jardiance 10mg  once daily, samples given for 2 weeks. - Switch from glimepiride to glipizide 10mg  ER bid. - Instruct to check fasting blood glucose aiming for <130 mg/dL fasting and <409 mg/dL before supper. -Call the office after 1 week with CBG readings. - Encourage application for patient assistance programs. - Advise on strict diet avoiding sweets, sugary drinks, and  high-calorie foods. - Encourage regular exercise. -F/U 2 mos w/labs  Hypertension - Blood pressure at 162/82 mmHg on first try, down to 138/79. Currently on hydrochlorothiazide and losartan. Concern for kidney impact with diabetes. - Increase Losartan 50mg  to twice daily. - Continue HCTZ qam. -F/U in 2 mos w/labs  Hyperlipidemia - On Crestor with good cholesterol levels 02/2023. - Continue Crestor as prescribed sending refill -F/U 2 mos w/labs    Subjective:    Outpatient Medications Prior to Visit  Medication Sig Dispense Refill  . Accu-Chek Softclix Lancets lancets E11.65 check glucose TID and PRN 100 each 0  . aspirin 81 MG tablet Take 81 mg by mouth daily.      Marland Kitchen glucose blood (ACCU-CHEK AVIVA PLUS) test strip E11.65 Check glucose TID and PRN 100 each 0  . levothyroxine (SYNTHROID) 125 MCG tablet TAKE 1 TABLET BY MOUTH BEFORE BREAKFAST 90 tablet 0  . metFORMIN (GLUCOPHAGE-XR) 500 MG 24 hr tablet TAKE 2 TABLETS BY MOUTH TWICE DAILY AFTER A MEAL (STOP SYNJARDY) 360 tablet 0  . tadalafil (CIALIS) 20 MG tablet Take 20 mg by mouth daily as needed.    Marland Kitchen glimepiride (AMARYL) 4 MG tablet Take 1 tablet (4 mg total) by mouth 2 (two) times daily with a meal. 180 tablet 1  . hydrochlorothiazide (HYDRODIURIL) 25 MG tablet Take 1 tablet (25 mg total) by mouth daily. 90 tablet 1  . losartan (COZAAR) 50 MG tablet Take 1 tablet (50 mg total) by mouth daily. 90 tablet 1  . rosuvastatin (CRESTOR) 5 MG tablet Take 1 tablet by mouth once daily 90 tablet 0  . triamcinolone cream (KENALOG) 0.1 %  Apply 1 Application topically 2 (two) times daily. (Patient not taking: Reported on 08/18/2023) 30 g 0  . JANUMET 50-1000 MG tablet TAKE 1 TABLET BY MOUTH TWICE DAILY WITH A MEAL (Patient not taking: Reported on 08/18/2023) 60 tablet 0   No facility-administered medications prior to visit.   Past Medical History:  Diagnosis Date  . Cancer [facility]) 2008   Prostate  . Screening for malignant neoplasm of colon  11/11/2021   Past Surgical History:  Procedure Laterality Date  . PROSTATE SURGERY  2008   No Known Allergies    Objective:    Physical Exam Vitals and nursing note reviewed.  Constitutional:      General: He is not in acute distress.    Appearance: Normal appearance.  HENT:     Head: Normocephalic.     Right Ear: Tympanic membrane and external ear normal.     Left Ear: Tympanic membrane and external ear normal.     Nose: Nose normal.     Mouth/Throat:     Mouth: Mucous membranes are moist.  Eyes:     Extraocular Movements: Extraocular movements intact.  Cardiovascular:     Rate and Rhythm: Normal rate and regular rhythm.  Pulmonary:     Effort: Pulmonary effort is normal.     Breath sounds: Normal breath sounds.  Abdominal:     General: Abdomen is flat. There is no distension.     Palpations: Abdomen is soft.     Tenderness: There is no abdominal tenderness.  Musculoskeletal:        General: Normal range of motion.     Cervical back: Normal range of motion.  Skin:    General: Skin is warm and dry.  Neurological:     Mental Status: He is alert and oriented to person, place, and time.  Psychiatric:        Mood and Affect: Mood normal.        Behavior: Behavior normal.        Judgment: Judgment normal.   BP 138/79   Pulse 74   Temp 97.8 F (36.6 C) (Temporal)   Ht 6\' 2"  (1.88 m)   Wt 245 lb 2 oz (111.2 kg)   SpO2 99%   BMI 31.47 kg/m  Wt Readings from Last 3 Encounters:  08/18/23 245 lb 2 oz (111.2 kg)  03/01/23 234 lb 6 oz (106.3 kg)  12/31/22 234 lb (106.1 kg)       Dulce Sellar, NP

## 2023-08-18 NOTE — Assessment & Plan Note (Signed)
 On Crestor with good cholesterol levels 02/2023. - Continue Crestor as prescribed sending refill -F/U 2 mos w/labs

## 2023-08-18 NOTE — Assessment & Plan Note (Signed)
 A1c at 12.1 indicates poor control. Gained 10 pounds. Current treatment includes metformin and glimepiride. Janumet not taken due to cost. Jardiance preferred for weight loss and kidney protection. Discussed cost issues and assistance programs. Emphasized blood glucose monitoring and lifestyle changes. Pt refusing to start insulin, despite recommendation. - Prescribe Jardiance 10mg  once daily, samples given for 2 weeks. - Switch from glimepiride to glipizide 10mg  ER bid. - Instruct to check fasting blood glucose aiming for <130 mg/dL fasting and <010 mg/dL before supper. -Call the office after 1 week with CBG readings. - Encourage application for patient assistance programs. - Advise on strict diet avoiding sweets, sugary drinks, and high-calorie foods. - Encourage regular exercise. -F/U 2 mos w/labs

## 2023-08-18 NOTE — Telephone Encounter (Signed)
 Copied from CRM [id number]. Topic: Clinical - Prescription Issue >> Aug 18, 2023 12:10 PM Sim Boast F wrote: Reason for CRM: Patient says that the Jardiance medication is $387 for 30 day supply and wants to know if there's a way to help with the cost or prescribe a alternative    Please advise, message sent to PA team to start PA for jardiance.      Pt does use MyChart.

## 2023-08-18 NOTE — Patient Instructions (Addendum)
 It was very nice to see you today!   Check fasting sugar in the morning before you eat and should be <130 and before supper and should be <170. Call me after 1 week with your numbers. Let me know how much the Jardiance copay is from pharmacy. If high, you can look online at [employer] for patient assistance application if your income qualifies. Stop the Glimeperide and start the Glipizide I sent over to the pharmacy with your other refills.   Drink more water! At least 2 liters per day. Exercise more getting your heart rate up to 130-140 beats per minute as tolerated to help lower your sugar. Cut out all sweets!  Schedule a 2 month follow up visit today.      PLEASE NOTE:  If you had any lab tests please let us know if you have not heard back within a few days. You may see your results on MyChart before we have a chance to review them but we will give you a call once they are reviewed by [place]. If we ordered any referrals today, please let us know if you have not heard from their office within the next week.

## 2023-08-18 NOTE — Assessment & Plan Note (Signed)
 Blood pressure at 162/82 mmHg on first try, down to 138/79. Currently on hydrochlorothiazide and losartan. Concern for kidney impact with diabetes. - Increase Losartan 50mg  to twice daily. - Continue HCTZ qam. -F/U in 2 mos w/labs

## 2023-08-20 ENCOUNTER — Other Ambulatory Visit (HOSPITAL_COMMUNITY): Payer: Self-pay

## 2023-08-20 ENCOUNTER — Telehealth: Payer: Self-pay

## 2023-08-20 NOTE — Telephone Encounter (Signed)
-----   Message from [facility] Y sent at 08/18/2023  4:28 PM EDT ----- Regarding: PA Jardiance Please start PA for pt's medication.   Thank you!

## 2023-08-20 NOTE — Telephone Encounter (Signed)
 Pharmacy Patient Advocate Encounter   Received notification from Physician's Office that prior authorization for JARDIANCE is required/requested.   Insurance verification completed.   The patient is insured through  [employer]  .   Per test claim: The current 30 day co-pay is, $387.  No PA needed at this time. This test claim was processed through [facility]- copay amounts may vary at other pharmacies due to pharmacy/plan contracts, or as the patient moves through the different stages of their insurance plan.

## 2023-08-20 NOTE — Telephone Encounter (Deleted)
 Pharmacy Patient Advocate Encounter   Received notification from Physician's Office that prior authorization for JARDIANCE is required/requested.   Insurance verification completed.   The patient is insured through  [employer]  .   Per test claim: The current 30 day co-pay is, $387.  No PA needed at this time. This test claim was processed through [facility]- copay amounts may vary at other pharmacies due to pharmacy/plan contracts, or as the patient moves through the different stages of their insurance plan.

## 2023-08-24 ENCOUNTER — Other Ambulatory Visit: Payer: Self-pay | Admitting: Family

## 2023-08-24 DIAGNOSIS — E1159 Type 2 diabetes mellitus with other circulatory complications: Secondary | ICD-10-CM

## 2023-08-30 ENCOUNTER — Ambulatory Visit: Payer: Medicare Other | Admitting: Family

## 2023-09-02 ENCOUNTER — Telehealth: Payer: Self-pay

## 2023-09-02 NOTE — Telephone Encounter (Signed)
-----   Message from Dulce Sellar sent at 09/02/2023  9:55 AM EDT ----- Regarding: Call patient Tell him thanks for CBG readings - they look much better! Clarify the Glipizide - I told him bid but sent in RX for qam - is he taking it just once a day or twice? Tell him to continue, and if bid, change in his med profile, & let me know. Did he send in the Jardiance patient assistance application? Thx

## 2023-09-02 NOTE — Telephone Encounter (Signed)
 I reached out to pt, Pt is currently taking glipizide qam. Advised pt to continue taking, pt verbalized understanding. Pt has not sent patient assistance forms or filled them out yet.

## 2023-10-12 ENCOUNTER — Other Ambulatory Visit: Payer: Self-pay | Admitting: Family

## 2023-10-12 DIAGNOSIS — E1165 Type 2 diabetes mellitus with hyperglycemia: Secondary | ICD-10-CM

## 2023-10-13 ENCOUNTER — Other Ambulatory Visit: Payer: Self-pay | Admitting: Family

## 2023-10-13 DIAGNOSIS — E1165 Type 2 diabetes mellitus with hyperglycemia: Secondary | ICD-10-CM

## 2023-10-13 MED ORDER — ACCU-CHEK AVIVA PLUS VI STRP: ORAL_STRIP | 0 refills | Status: DC

## 2023-10-13 NOTE — Telephone Encounter (Signed)
 Copied from CRM [id number]. Topic: Clinical - Medication Refill >> Oct 13, 2023 12:58 PM [place] E wrote: Medication: glucose blood (ACCU-CHEK AVIVA PLUS) test strip   Has the patient contacted their pharmacy? No (Agent: If no, request that the patient contact the pharmacy for the refill. If patient does not wish to contact the pharmacy document the reason why and proceed with request.) (Agent: If yes, when and what did the pharmacy advise?)  This is the patient's preferred pharmacy:  [facility] [address], [place] - [address] [place] [address] Phone: [phone] Fax: [phone]  Is this the correct pharmacy for this prescription? Yes If no, delete pharmacy and type the correct one.   Has the prescription been filled recently? No  Is the patient out of the medication? No, 1 test strip left.  Has the patient been seen for an appointment in the last year OR does the patient have an upcoming appointment? Yes  Can we respond through MyChart? Yes  Agent: Please be advised that Rx refills may take up to 3 business days. We ask that you follow-up with your pharmacy.

## 2023-10-19 ENCOUNTER — Encounter: Payer: Self-pay | Admitting: Family

## 2023-10-19 ENCOUNTER — Ambulatory Visit (INDEPENDENT_AMBULATORY_CARE_PROVIDER_SITE_OTHER): Admitting: Family

## 2023-10-19 VITALS — BP 136/78 | HR 71 | Ht 74.0 in | Wt 234.0 lb

## 2023-10-19 DIAGNOSIS — E1165 Type 2 diabetes mellitus with hyperglycemia: Secondary | ICD-10-CM

## 2023-10-19 DIAGNOSIS — E1159 Type 2 diabetes mellitus with other circulatory complications: Secondary | ICD-10-CM

## 2023-10-19 DIAGNOSIS — Z7984 Long term (current) use of oral hypoglycemic drugs: Secondary | ICD-10-CM

## 2023-10-19 DIAGNOSIS — E669 Obesity, unspecified: Secondary | ICD-10-CM

## 2023-10-19 DIAGNOSIS — N182 Chronic kidney disease, stage 2 (mild): Secondary | ICD-10-CM | POA: Diagnosis not present

## 2023-10-19 DIAGNOSIS — I152 Hypertension secondary to endocrine disorders: Secondary | ICD-10-CM | POA: Diagnosis not present

## 2023-10-19 DIAGNOSIS — E1122 Type 2 diabetes mellitus with diabetic chronic kidney disease: Secondary | ICD-10-CM | POA: Diagnosis not present

## 2023-10-19 DIAGNOSIS — E1169 Type 2 diabetes mellitus with other specified complication: Secondary | ICD-10-CM

## 2023-10-19 LAB — COMPREHENSIVE METABOLIC PANEL WITH GFR
ALT: 18 U/L (ref 0–53)
AST: 24 U/L (ref 0–37)
Albumin: 4.2 g/dL (ref 3.5–5.2)
Alkaline Phosphatase: 47 U/L (ref 39–117)
BUN: 21 mg/dL (ref 6–23)
CO2: 26 meq/L (ref 19–32)
Calcium: 9.9 mg/dL (ref 8.4–10.5)
Chloride: 101 meq/L (ref 96–112)
Creatinine, Ser: 1.21 mg/dL (ref 0.40–1.50)
GFR: 59.64 mL/min — ABNORMAL LOW (ref >60.00)
Glucose, Bld: 224 mg/dL — ABNORMAL HIGH (ref 70–99)
Potassium: 3.8 meq/L (ref 3.5–5.1)
Sodium: 135 meq/L (ref 135–145)
Total Bilirubin: 0.6 mg/dL (ref 0.2–1.2)
Total Protein: 7.4 g/dL (ref 6.0–8.3)

## 2023-10-19 LAB — POCT GLYCOSYLATED HEMOGLOBIN (HGB A1C): Hemoglobin A1C: 7.5 % — AB (ref 4.0–5.6)

## 2023-10-19 NOTE — Progress Notes (Signed)
 Patient ID: Brian Marks, male    DOB: August 18, 1950, 73 y.o.   MRN: [id number]  Chief Complaint  Patient presents with   2 Month Follow-up HTN, Diabetes    Pt states he is making an effort to bring his BP and blood sugar. He is monitoring his blood pressure and blood sugar and logging them. Has spoken to a friend for help on lowering his blood sugar. Did not do pt asst paperwork for Jardiance , so he has not started taking it.   Discussed the use of AI scribe software for clinical note transcription with the patient, who gave verbal consent to proceed.  History of Present Illness Brian Marks is a 73 year old male with type 2 diabetes and hypertension who presents for diabetes management and follow-up.  Over the past two months, his diabetes management has significantly improved, with A1c decreasing from 12 to 7.5. This improvement is due to dietary changes and increased physical activity, including landscaping work. He has brought in his CBG readings for the last 2 months, monitoring bid, noting occasional morning elevations but generally well-controlled levels. Blood sugar can drop to 69 if he does not eat after taking glipizide . He is currently taking glipizide  and metformin .  His medication regimen includes glipizide , metformin , losartan , hydrochlorothiazide , Crestor , baby aspirin, and thyroid  medication. He takes losartan  once a day. Lifestyle changes include drinking two liters of water daily and avoiding sugary drinks, opting for water and occasionally low-sugar Gatorade. He eats a balanced diet with a focus on protein and low carbohydrates, including salads with rotisserie chicken and whole wheat toast. He avoids white bread and uses oil and vinegar as salad dressing. He also consumes Boost for diabetes as a nutritional supplement.  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 He takes breaks for meals, typically eating a protein-rich  salad for lunch and a low-carb breakfast. He is mindful of his blood sugar levels and adjusts his diet accordingly.  Assessment & Plan Type 2 Diabetes Mellitus with hyperglycemia - Type 2 diabetes with improved control, A1c reduced from 12 to 7.5. Overall home CBGs in good range. Weight loss and increased glipizide  dosage contributed to better glycemic control. Discussed hypoglycemia risk with glipizide . Praise given for efforts and need for continued lifestyle changes.  - Continue Glipizide  10mg  ER bid, Metformin  1000g bid, dietary modifications and regular blood glucose monitoring. - Check CMP today, discussed reducing Metformin  if kidney fx declined - Encourage completion of Jardiance  assistance application for renal protection. - Monitor fasting and pre-supper blood glucose levels regularly. - Recheck A1c in three months.  Chronic Kidney Disease, unspecified - Chronic kidney disease with potential renal damage from hyperglycemia and hypertension. Discussed metformin 's impact on renal function. - Order renal function tests today - Consider reducing metformin  dosage if renal function is impaired. - F/U in 3 mos  Hypertension - Hypertension improved possibly with weight loss. Losartan  dosage increased for better control last visit, but he has not increased dose. - Continue hydrochlorothiazide  12.5mg  qam and losartan  50mg  bid. - Ensure losartan  is taken twice daily. - Monitor blood pressure regularly. - F/U in 3 mos   Subjective:     Outpatient Medications Prior to Visit  Medication Sig Dispense Refill   Accu-Chek Softclix Lancets lancets USE   TO CHECK GLUCOSE THREE TIMES DAILY AND AS NEEDED 100 each 0   aspirin 81 MG tablet Take 81 mg by mouth daily.  glipiZIDE  (GLIPIZIDE  XL) 10 MG 24 hr tablet Take 1 tablet (10 mg total) by mouth daily with breakfast. 180 tablet 1   glucose blood (ACCU-CHEK AVIVA PLUS) test strip E11.65 Check glucose TID and PRN 100 each 0    hydrochlorothiazide  (HYDRODIURIL ) 25 MG tablet Take 1 tablet (25 mg total) by mouth daily. 90 tablet 1   levothyroxine  (SYNTHROID ) 125 MCG tablet TAKE 1 TABLET BY MOUTH BEFORE BREAKFAST 90 tablet 0   losartan  (COZAAR ) 50 MG tablet Take 1 tablet (50 mg total) by mouth 2 (two) times daily. 90 tablet 1   metFORMIN  (GLUCOPHAGE -XR) 500 MG 24 hr tablet Take 2 tablets (1,000 mg total) by mouth 2 (two) times daily with a meal. 360 tablet 0   rosuvastatin  (CRESTOR ) 5 MG tablet Take 1 tablet (5 mg total) by mouth daily. 90 tablet 1   tadalafil (CIALIS) 20 MG tablet Take 20 mg by mouth daily as needed.     empagliflozin  (JARDIANCE ) 10 MG TABS tablet Take 1 tablet (10 mg total) by mouth daily before breakfast. (Patient not taking: Reported on 10/19/2023) 30 tablet 2   triamcinolone  cream (KENALOG ) 0.1 % Apply 1 Application topically 2 (two) times daily. (Patient not taking: Reported on 10/19/2023) 30 g 0   No facility-administered medications prior to visit.   Past Medical History:  Diagnosis Date   Cancer [facility]) 2008   Prostate   Change in bowel habit 11/11/2021   Screening for malignant neoplasm of colon 11/11/2021   Past Surgical History:  Procedure Laterality Date   PROSTATE SURGERY  2008   No Known Allergies    Objective:    Physical Exam Vitals and nursing note reviewed.  Constitutional:      General: He is not in acute distress.    Appearance: Normal appearance. He is obese.  HENT:     Head: Normocephalic.  Cardiovascular:     Rate and Rhythm: Normal rate and regular rhythm.  Pulmonary:     Effort: Pulmonary effort is normal.     Breath sounds: Normal breath sounds.  Musculoskeletal:        General: Normal range of motion.     Cervical back: Normal range of motion.  Skin:    General: Skin is warm and dry.  Neurological:     Mental Status: He is alert and oriented to person, place, and time.  Psychiatric:        Mood and Affect: Mood normal.    BP 136/78 (BP Location: Right  Arm, Patient Position: Sitting, Cuff Size: Large)   Pulse 71   Ht 6\' 2"  (1.88 m)   Wt 234 lb (106.1 kg)   SpO2 98%   BMI 30.04 kg/m  Wt Readings from Last 3 Encounters:  10/19/23 234 lb (106.1 kg)  08/18/23 245 lb 2 oz (111.2 kg)  03/01/23 234 lb 6 oz (106.3 kg)      Versa Gore, NP

## 2023-10-19 NOTE — Assessment & Plan Note (Addendum)
 Type 2 diabetes with improved control, A1c reduced from 12 to 7.5. Weight loss and increased glipizide  dosage contributed to better glycemic control. Discussed hypoglycemia risk with glipizide . Praise given for efforts and need for continued lifestyle changes.  - Continue Glipizide  10mg  ER bid, Metformin  1000g bid, dietary modifications and regular blood glucose monitoring. - Check CMP today, discussed reducing Metformin  if kidney fx declined - Encourage completion of Jardiance  assistance application for renal protection. - Monitor fasting and pre-supper blood glucose levels regularly. - Recheck A1c in three months.

## 2023-10-19 NOTE — Assessment & Plan Note (Signed)
 Chronic kidney disease with potential renal damage from hyperglycemia and hypertension. Discussed metformin 's impact on renal function. - Order renal function tests today - Consider reducing metformin  dosage if renal function is impaired. - F/U in 3 mos

## 2023-10-19 NOTE — Assessment & Plan Note (Signed)
 Hypertension improved possibly with weight loss. Losartan  dosage increased for better control last visit, but he has not increased dose. - Continue hydrochlorothiazide  12.5mg  qam and losartan  50mg  bid. - Ensure losartan  is taken twice daily. - Monitor blood pressure regularly. - F/U in 3 mos

## 2023-10-20 ENCOUNTER — Ambulatory Visit: Payer: Self-pay | Admitting: Family

## 2023-11-01 ENCOUNTER — Other Ambulatory Visit: Payer: Self-pay | Admitting: Family

## 2023-11-01 ENCOUNTER — Other Ambulatory Visit: Payer: Self-pay

## 2023-11-01 DIAGNOSIS — E039 Hypothyroidism, unspecified: Secondary | ICD-10-CM

## 2023-11-01 MED ORDER — LEVOTHYROXINE SODIUM 125 MCG PO TABS: 125.0000 ug | ORAL_TABLET | Freq: Every day | ORAL | 0 refills | Status: DC

## 2023-11-08 DIAGNOSIS — M545 Low back pain, unspecified: Secondary | ICD-10-CM | POA: Diagnosis not present

## 2023-11-08 DIAGNOSIS — M12812 Other specific arthropathies, not elsewhere classified, left shoulder: Secondary | ICD-10-CM | POA: Diagnosis not present

## 2023-11-08 DIAGNOSIS — M19012 Primary osteoarthritis, left shoulder: Secondary | ICD-10-CM | POA: Diagnosis not present

## 2023-11-08 DIAGNOSIS — G8929 Other chronic pain: Secondary | ICD-10-CM | POA: Diagnosis not present

## 2023-11-13 ENCOUNTER — Other Ambulatory Visit: Payer: Self-pay | Admitting: Family

## 2023-11-13 DIAGNOSIS — E1159 Type 2 diabetes mellitus with other circulatory complications: Secondary | ICD-10-CM

## 2023-11-13 DIAGNOSIS — E1165 Type 2 diabetes mellitus with hyperglycemia: Secondary | ICD-10-CM

## 2023-12-26 ENCOUNTER — Other Ambulatory Visit: Payer: Self-pay | Admitting: Family

## 2023-12-26 DIAGNOSIS — I152 Hypertension secondary to endocrine disorders: Secondary | ICD-10-CM

## 2024-01-06 ENCOUNTER — Ambulatory Visit (INDEPENDENT_AMBULATORY_CARE_PROVIDER_SITE_OTHER): Payer: Medicare Other

## 2024-01-06 VITALS — Ht 74.0 in | Wt 234.0 lb

## 2024-01-06 DIAGNOSIS — Z0000 Encounter for general adult medical examination without abnormal findings: Secondary | ICD-10-CM

## 2024-01-06 NOTE — Progress Notes (Signed)
 Subjective:   Brian Marks is a 73 y.o. who presents for a Medicare Wellness preventive visit.  As a reminder, Annual Wellness Visits don't include a physical exam, and some assessments may be limited, especially if this visit is performed virtually. We may recommend an in-person follow-up visit with your provider if needed.  Visit Complete: Virtual I connected with  Ubaldo GORMAN Molt on 01/06/24 by a audio enabled telemedicine application and verified that I am speaking with the correct person using two identifiers.  Patient Location: Home  Provider Location: Home Office  I discussed the limitations of evaluation and management by telemedicine. The patient expressed understanding and agreed to proceed.  Vital Signs: Because this visit was a virtual/telehealth visit, some criteria may be missing or patient reported. Any vitals not documented were not able to be obtained and vitals that have been documented are patient reported.  VideoDeclined- This patient declined Librarian, academic. Therefore the visit was completed with audio only.  Persons Participating in Visit: Patient.  AWV Questionnaire: No: Patient Medicare AWV questionnaire was not completed prior to this visit.  Cardiac Risk Factors include: advanced age (>51men, >8 women);diabetes mellitus;dyslipidemia;hypertension;male gender     Objective:    Today's Vitals   01/06/24 1350  Weight: 234 lb (106.1 kg)  Height: 6' 2 (1.88 m)   Body mass index is 30.04 kg/m.     01/06/2024    1:54 PM 12/31/2022    3:29 PM 12/16/2021   11:59 AM 11/30/2019    3:42 PM  Advanced Directives  Does Patient Have a Medical Advance Directive? No No No No  Would patient like information on creating a medical advance directive? Yes (MAU/Ambulatory/Procedural Areas - Information given) No - Patient declined Yes (MAU/Ambulatory/Procedural Areas - Information given) No - Patient declined    Current Medications  (verified) Outpatient Encounter Medications as of 01/06/2024  Medication Sig   Accu-Chek Softclix Lancets lancets USE   TO CHECK GLUCOSE THREE TIMES DAILY AND AS NEEDED   aspirin 81 MG tablet Take 81 mg by mouth daily.     glipiZIDE  (GLIPIZIDE  XL) 10 MG 24 hr tablet Take 1 tablet (10 mg total) by mouth daily with breakfast.   glucose blood (ACCU-CHEK AVIVA PLUS) test strip E11.65 Check glucose TID and PRN   hydrochlorothiazide  (HYDRODIURIL ) 25 MG tablet Take 1 tablet (25 mg total) by mouth daily.   levothyroxine  (SYNTHROID ) 125 MCG tablet TAKE 1 TABLET BY MOUTH BEFORE BREAKFAST   levothyroxine  (SYNTHROID ) 125 MCG tablet Take 1 tablet (125 mcg total) by mouth daily before breakfast.   losartan  (COZAAR ) 50 MG tablet Take 1 tablet by mouth twice daily   metFORMIN  (GLUCOPHAGE -XR) 500 MG 24 hr tablet TAKE 2 TABLETS BY MOUTH TWICE DAILY WITH MEALS   rosuvastatin  (CRESTOR ) 5 MG tablet Take 1 tablet (5 mg total) by mouth daily.   tadalafil (CIALIS) 20 MG tablet Take 20 mg by mouth daily as needed.   No facility-administered encounter medications on file as of 01/06/2024.    Allergies (verified) Patient has no known allergies.   History: Past Medical History:  Diagnosis Date   Cancer ([facility]CC) 2008   Prostate   Change in bowel habit 11/11/2021   Screening for malignant neoplasm of colon 11/11/2021   Past Surgical History:  Procedure Laterality Date   PROSTATE SURGERY  2008   Family History  Problem Relation Age of Onset   Kidney disease Mother    Hyperlipidemia Other    Hypertension Other  Social History   Socioeconomic History   Marital status: Married    Spouse name: Not on file   Number of children: Not on file   Years of education: Not on file   Highest education level: Some college, no degree  Occupational History   Not on file  Tobacco Use   Smoking status: Never   Smokeless tobacco: Never  Vaping Use   Vaping status: Never Used  Substance and Sexual Activity   Alcohol  use: No   Drug use: No   Sexual activity: Not on file  Other Topics Concern   Not on file  Social History Narrative   Not on file   Social Drivers of Health   Financial Resource Strain: Low Risk  (01/06/2024)   Overall Financial Resource Strain (CARDIA)    Difficulty of Paying Living Expenses: Not hard at all  Food Insecurity: No Food Insecurity (01/06/2024)   Hunger Vital Sign    Worried About Running Out of Food in the Last Year: Never true    Ran Out of Food in the Last Year: Never true  Transportation Needs: No Transportation Needs (01/06/2024)   PRAPARE - Administrator, Civil Service (Medical): No    Lack of Transportation (Non-Medical): No  Physical Activity: Sufficiently Active (01/06/2024)   Exercise Vital Sign    Days of Exercise per Week: 6 days    Minutes of Exercise per Session: 150+ min  Stress: No Stress Concern Present (01/06/2024)   [employer] of Occupational Health - Occupational Stress Questionnaire    Feeling of Stress: Only a little  Social Connections: Moderately Integrated (01/06/2024)   Social Connection and Isolation Panel    Frequency of Communication with Friends and Family: More than three times a week    Frequency of Social Gatherings with Friends and Family: More than three times a week    Attends Religious Services: More than 4 times per year    Active Member of [employer] or Organizations: No    Attends Banker Meetings: Never    Marital Status: Married    Tobacco Counseling Counseling given: Not Answered    Clinical Intake:  Pre-visit preparation completed: Yes  Pain : No/denies pain  Diabetes: Yes CBG done?: No Did pt. bring in CBG monitor from home?: No  Lab Results  Component Value Date   HGBA1C 7.5 (A) 10/19/2023   HGBA1C 12.0 (H) 08/13/2023   HGBA1C 9.3 (H) 03/01/2023     How often do you need to have someone help you when you read instructions, pamphlets, or other written materials from your doctor  or pharmacy?: 1 - Never  Interpreter Needed?: No  Information entered by :: Charmaine Bloodgood LPN   Activities of Daily Living     01/06/2024    1:52 PM  In your present state of health, do you have any difficulty performing the following activities:  Hearing? 0  Vision? 0  Difficulty concentrating or making decisions? 0  Walking or climbing stairs? 0  Dressing or bathing? 0  Doing errands, shopping? 0  Preparing Food and eating ? N  Using the Toilet? N  In the past six months, have you accidently leaked urine? N  Do you have problems with loss of bowel control? N  Managing your Medications? N  Managing your Finances? N  Housekeeping or managing your Housekeeping? N    Patient Care Team: Lucius Krabbe, NP as PCP - General (Family Medicine) Janit Thresa HERO, DPM as Consulting  Physician (Podiatry)  I have updated your Care Teams any recent Medical Services you may have received from other providers in the past year.     Assessment:   This is a routine wellness examination for Nealy.  Hearing/Vision screen Hearing Screening - Comments:: Denies hearing difficulties   Vision Screening - Comments:: No vision problems; will schedule routine eye exam (list of providers given)    Goals Addressed             This Visit's Progress    Patient Stated   On track    Stay healthy       Depression Screen     01/06/2024    1:53 PM 03/01/2023    9:11 AM 12/31/2022    3:30 PM 12/16/2021   11:57 AM 06/12/2021    8:55 AM  PHQ 2/9 Scores  PHQ - 2 Score 0 0 0 0 0    Fall Risk     01/06/2024    1:55 PM 03/01/2023    9:11 AM 12/31/2022    3:32 PM 12/16/2021   12:01 PM 06/12/2021    8:55 AM  Fall Risk   Falls in the past year? 0 0 0 0 0  Number falls in past yr: 0 0 0 0   Injury with Fall? 0 0 0 0   Risk for fall due to : No Fall Risks No Fall Risks Impaired vision    Follow up Falls prevention discussed;Education provided;Falls evaluation completed Falls evaluation completed  Falls prevention discussed Falls prevention discussed       Data saved with a previous flowsheet row definition    MEDICARE RISK AT HOME:  Medicare Risk at Home Any stairs in or around the home?: No If so, are there any without handrails?: No Home free of loose throw rugs in walkways, pet beds, electrical cords, etc?: Yes Adequate lighting in your home to reduce risk of falls?: Yes Life alert?: No Use of a cane, walker or w/c?: No Grab bars in the bathroom?: Yes Shower chair or bench in shower?: No Elevated toilet seat or a handicapped toilet?: No  TIMED UP AND GO:  Was the test performed?  No  Cognitive Function: Declined/Normal: No cognitive concerns noted by patient or family. Patient alert, oriented, able to answer questions appropriately and recall recent events. No signs of memory loss or confusion.        12/31/2022    3:32 PM  6CIT Screen  What Year? 0 points  What month? 0 points  What time? 0 points  Count back from 20 0 points  Months in reverse 0 points  Repeat phrase 0 points  Total Score 0 points    Immunizations Immunization History  Administered Date(s) Administered   Fluad Quad(high Dose 65+) 03/11/2016, 04/08/2021, 03/04/2022   Fluad Trivalent(High Dose 65+) 03/01/2023   Influenza Inj Mdck Quad Pf 03/18/2015   Influenza, High Dose Seasonal PF 04/17/2018, 03/16/2019   Pneumococcal Polysaccharide-23 11/30/2012   Tdap 09/22/2004   Zoster Recombinant(Shingrix) 03/31/2021    Screening Tests Health Maintenance  Topic Date Due   Zoster Vaccines- Shingrix (2 of 2) 05/26/2021   FOOT EXAM  03/03/2023   INFLUENZA VACCINE  12/24/2023   OPHTHALMOLOGY EXAM  02/08/2024 (Originally 01/13/2023)   DTaP/Tdap/Td (2 - Td or Tdap) 05/10/2024 (Originally 09/23/2014)   Pneumococcal Vaccine: 50+ Years (2 of 2 - PCV) 05/10/2024 (Originally 11/30/2013)   Diabetic kidney evaluation - Urine ACR  05/10/2028 (Originally [birth year]/1968)   HEMOGLOBIN A1C  01/19/2024   Diabetic kidney  evaluation - eGFR measurement  10/18/2024   Medicare Annual Wellness (AWV)  01/05/2025   Colonoscopy  12/25/2029   Hepatitis C Screening  Completed   HPV VACCINES  Aged Out   Meningococcal B Vaccine  Aged Out   Pneumococcal Vaccine  Discontinued   COVID-19 Vaccine  Discontinued    Health Maintenance  Health Maintenance Due  Topic Date Due   Zoster Vaccines- Shingrix (2 of 2) 05/26/2021   FOOT EXAM  03/03/2023   INFLUENZA VACCINE  12/24/2023   Health Maintenance Items Addressed: Information provided on vaccine recommendations as well as list of eye care providers in the area   Additional Screening:  Vision Screening: Recommended annual ophthalmology exams for early detection of glaucoma and other disorders of the eye. Would you like a referral to an eye doctor? No    Dental Screening: Recommended annual dental exams for proper oral hygiene  Community Resource Referral / Chronic Care Management: CRR required this visit?  No   CCM required this visit?  No   Plan:    I have personally reviewed and noted the following in the patient's chart:   Medical and social history Use of alcohol, tobacco or illicit drugs  Current medications and supplements including opioid prescriptions. Patient is not currently taking opioid prescriptions. Functional ability and status Nutritional status Physical activity Advanced directives List of other physicians Hospitalizations, surgeries, and ER visits in previous 12 months Vitals Screenings to include cognitive, depression, and falls Referrals and appointments  In addition, I have reviewed and discussed with patient certain preventive protocols, quality metrics, and best practice recommendations. A written personalized care plan for preventive services as well as general preventive health recommendations were provided to patient.   Lavelle Pfeiffer [place], [place]   1/85/7974   After Visit Summary: (MyChart) Due to this being a  telephonic visit, the after visit summary with patients personalized plan was offered to patient via MyChart   Notes: Nothing significant to report at this time.

## 2024-01-06 NOTE — Patient Instructions (Addendum)
 Brian Marks , Thank you for taking time out of your busy schedule to complete your Annual Wellness Visit with me. I enjoyed our conversation and look forward to speaking with you again next year. I, as well as your care team,  appreciate your ongoing commitment to your health goals. Please review the following plan we discussed and let me know if I can assist you in the future. Your Game plan/ To Do List    Referrals:  There are several Eye Doctors in your area. Here are a few that usually accept all insurance types:  [facility] [address] [place], [place] [address] Phone: [phone]  [facility] [address] [place], [place] [address] Phone: [phone]  [facility] [address] [place], [place] [address] Phone: [phone]  [facility] [address] [place], [place] [address] Phone: [phone]  [facility] [address] [place], [place] [address] Phone: [phone]   Follow up Visits: We will see or speak with you next year for your Next Medicare AWV with our clinical staff Have you seen your provider in the last 6 months (3 months if uncontrolled diabetes)? Yes  Clinician Recommendations:  Aim for 30 minutes of exercise or brisk walking, 6-8 glasses of water, and 5 servings of fruits and vegetables each day.      This is a list of the screenings recommended for you:  Health Maintenance  Topic Date Due   Zoster (Shingles) Vaccine (2 of 2) 05/26/2021   Complete foot exam   03/03/2023   Flu Shot  12/24/2023   Eye exam for diabetics  02/08/2024*   DTaP/Tdap/Td vaccine (2 - Td or Tdap) 05/10/2024*   Pneumococcal Vaccine for age over 57 (2 of 2 - PCV) 05/10/2024*   Yearly kidney health urinalysis for diabetes  05/10/2028*   Hemoglobin A1C  01/19/2024   Yearly kidney function blood test for diabetes  10/18/2024   Medicare Annual Wellness Visit  01/05/2025   Colon Cancer Screening  12/25/2029   Hepatitis C  Screening  Completed   HPV Vaccine  Aged Out   Meningitis B Vaccine  Aged Out   Pneumococcal Vaccine  Discontinued   COVID-19 Vaccine  Discontinued  *Topic was postponed. The date shown is not the original due date.    Advanced directives: (ACP Link)Information on Advanced Care Planning can be found at [place]  Secretary of [facility] Advance Health Care Directives Advance Health Care Directives. [email or web address]  Advance Care Planning is important because it:  [x]  Makes sure you receive the medical care that is consistent with your values, goals, and preferences  [x]  It provides guidance to your family and loved ones and reduces their decisional burden about whether or not they are making the right decisions based on your wishes.  Follow the link provided in your after visit summary or read over the paperwork we have mailed to you to help you started getting your Advance Directives in place. If you need assistance in completing these, please reach out to us  so that we can help you!  See attachments for Preventive Care and Fall Prevention Tips.

## 2024-02-08 ENCOUNTER — Other Ambulatory Visit: Payer: Self-pay | Admitting: Family

## 2024-02-08 DIAGNOSIS — E1165 Type 2 diabetes mellitus with hyperglycemia: Secondary | ICD-10-CM

## 2024-02-12 ENCOUNTER — Other Ambulatory Visit: Payer: Self-pay | Admitting: Family

## 2024-02-12 DIAGNOSIS — I152 Hypertension secondary to endocrine disorders: Secondary | ICD-10-CM

## 2024-02-21 ENCOUNTER — Other Ambulatory Visit: Payer: Self-pay | Admitting: Family

## 2024-02-21 DIAGNOSIS — E1159 Type 2 diabetes mellitus with other circulatory complications: Secondary | ICD-10-CM

## 2024-02-27 ENCOUNTER — Other Ambulatory Visit: Payer: Self-pay | Admitting: Family

## 2024-02-27 DIAGNOSIS — E1159 Type 2 diabetes mellitus with other circulatory complications: Secondary | ICD-10-CM

## 2024-03-13 NOTE — Progress Notes (Signed)
 Brian Marks                                          MRN: [id number]   03/13/2024   The VBCI Quality Team Specialist reviewed this patient medical record for the purposes of chart review for care gap closure. The following were reviewed: chart review for care gap closure-diabetic eye exam and kidney health evaluation for diabetes:eGFR  and uACR.    VBCI Quality Team

## 2024-03-13 NOTE — Progress Notes (Signed)
 VIHAAN GLOSS                                          MRN: [id number]   03/13/2024   The VBCI Quality Team Specialist reviewed this patient medical record for the purposes of chart review for care gap closure. The following were reviewed: abstraction for care gap closure-glycemic status assessment.    VBCI Quality Team

## 2024-03-17 ENCOUNTER — Other Ambulatory Visit: Payer: Self-pay | Admitting: Family

## 2024-03-17 DIAGNOSIS — I152 Hypertension secondary to endocrine disorders: Secondary | ICD-10-CM

## 2024-04-14 ENCOUNTER — Other Ambulatory Visit: Payer: Self-pay | Admitting: Family

## 2024-04-14 DIAGNOSIS — E1159 Type 2 diabetes mellitus with other circulatory complications: Secondary | ICD-10-CM

## 2024-05-01 ENCOUNTER — Other Ambulatory Visit: Payer: Self-pay | Admitting: Family

## 2024-05-01 DIAGNOSIS — E1169 Type 2 diabetes mellitus with other specified complication: Secondary | ICD-10-CM

## 2024-05-01 DIAGNOSIS — E039 Hypothyroidism, unspecified: Secondary | ICD-10-CM

## 2024-05-02 NOTE — Progress Notes (Signed)
 MURVIN GIFT                                          MRN: [id number]   05/02/2024   The VBCI Quality Team Specialist reviewed this patient medical record for the purposes of chart review for care gap closure. The following were reviewed: chart review for care gap closure-diabetic eye exam and kidney health evaluation for diabetes:eGFR  and uACR.    VBCI Quality Team

## 2024-05-09 ENCOUNTER — Encounter: Payer: Self-pay | Admitting: Family

## 2024-05-09 ENCOUNTER — Ambulatory Visit: Admitting: Family

## 2024-05-09 ENCOUNTER — Other Ambulatory Visit

## 2024-05-09 VITALS — BP 138/80 | HR 89 | Temp 98.1°F | Ht 74.0 in | Wt 241.5 lb

## 2024-05-09 DIAGNOSIS — N182 Chronic kidney disease, stage 2 (mild): Secondary | ICD-10-CM | POA: Diagnosis not present

## 2024-05-09 DIAGNOSIS — E1159 Type 2 diabetes mellitus with other circulatory complications: Secondary | ICD-10-CM

## 2024-05-09 DIAGNOSIS — E1169 Type 2 diabetes mellitus with other specified complication: Secondary | ICD-10-CM | POA: Diagnosis not present

## 2024-05-09 DIAGNOSIS — Z7984 Long term (current) use of oral hypoglycemic drugs: Secondary | ICD-10-CM

## 2024-05-09 DIAGNOSIS — E1165 Type 2 diabetes mellitus with hyperglycemia: Secondary | ICD-10-CM

## 2024-05-09 DIAGNOSIS — E669 Obesity, unspecified: Secondary | ICD-10-CM

## 2024-05-09 DIAGNOSIS — E039 Hypothyroidism, unspecified: Secondary | ICD-10-CM

## 2024-05-09 DIAGNOSIS — E785 Hyperlipidemia, unspecified: Secondary | ICD-10-CM

## 2024-05-09 DIAGNOSIS — Z23 Encounter for immunization: Secondary | ICD-10-CM | POA: Diagnosis not present

## 2024-05-09 DIAGNOSIS — I152 Hypertension secondary to endocrine disorders: Secondary | ICD-10-CM

## 2024-05-09 DIAGNOSIS — E66811 Obesity, class 1: Secondary | ICD-10-CM

## 2024-05-09 DIAGNOSIS — E1122 Type 2 diabetes mellitus with diabetic chronic kidney disease: Secondary | ICD-10-CM

## 2024-05-09 LAB — COMPREHENSIVE METABOLIC PANEL WITH GFR
ALT: 16 U/L (ref 0–53)
AST: 17 U/L (ref 5–37)
Albumin: 4.2 g/dL (ref 3.5–5.2)
Alkaline Phosphatase: 64 U/L (ref 39–117)
BUN: 15 mg/dL (ref 6–23)
CO2: 29 meq/L (ref 19–32)
Calcium: 9.5 mg/dL (ref 8.4–10.5)
Chloride: 100 meq/L (ref 96–112)
Creatinine, Ser: 1.27 mg/dL (ref 0.40–1.50)
GFR: 56.06 mL/min — ABNORMAL LOW (ref >60.00)
Glucose, Bld: 246 mg/dL — ABNORMAL HIGH (ref 70–99)
Potassium: 4.2 meq/L (ref 3.5–5.1)
Sodium: 135 meq/L (ref 135–145)
Total Bilirubin: 0.6 mg/dL (ref 0.2–1.2)
Total Protein: 7.2 g/dL (ref 6.0–8.3)

## 2024-05-09 LAB — MICROALBUMIN / CREATININE URINE RATIO
Creatinine,U: 35.9 mg/dL
Microalb Creat Ratio: 95.9 mg/g — ABNORMAL HIGH (ref 0.0–30.0)
Microalb, Ur: 3.4 mg/dL — ABNORMAL HIGH (ref 0.7–1.9)

## 2024-05-09 LAB — TSH: TSH: 4.83 u[IU]/mL (ref 0.35–5.50)

## 2024-05-09 MED ORDER — ACCU-CHEK SOFTCLIX LANCETS MISC: 5 refills | Status: AC

## 2024-05-09 MED ORDER — ROSUVASTATIN CALCIUM 5 MG PO TABS: 5.0000 mg | ORAL_TABLET | Freq: Every day | ORAL | 1 refills | Status: AC

## 2024-05-09 MED ORDER — ACCU-CHEK AVIVA PLUS VI STRP: ORAL_STRIP | 1 refills | Status: AC

## 2024-05-09 MED ORDER — LEVOTHYROXINE SODIUM 125 MCG PO TABS: 125.0000 ug | ORAL_TABLET | Freq: Every day | ORAL | 1 refills | Status: AC

## 2024-05-09 MED ORDER — GLIPIZIDE ER 10 MG PO TB24: 10.0000 mg | ORAL_TABLET | Freq: Every day | ORAL | 1 refills | Status: DC

## 2024-05-09 MED ORDER — LOSARTAN POTASSIUM 50 MG PO TABS: 50.0000 mg | ORAL_TABLET | Freq: Two times a day (BID) | ORAL | 1 refills | Status: AC

## 2024-05-09 MED ORDER — HYDROCHLOROTHIAZIDE 25 MG PO TABS: 25.0000 mg | ORAL_TABLET | Freq: Every day | ORAL | 1 refills | Status: AC

## 2024-05-09 MED ORDER — METFORMIN HCL ER 500 MG PO TB24: 1000.0000 mg | ORAL_TABLET | Freq: Two times a day (BID) | ORAL | 1 refills | Status: AC

## 2024-05-09 NOTE — Progress Notes (Signed)
 Patient ID: Brian Marks, male    DOB: March 30, 1951, 73 y.o.   MRN: [id number]  Chief Complaint  Patient presents with   Medication Refill  Discussed the use of AI scribe software for clinical note transcription with the patient, who gave verbal consent to proceed.  History of Present Illness Brian Marks is a 73 year old male with diabetes and hypertension who presents for a follow-up visit.  He has had fluctuating blood sugars up to 190 to 200 mg/dL. He takes metformin  twice daily and glipizide  once daily but increased dose to twice a day & recently went without glipizide  for several days because insurance would not cover more pills. He is concerned about his current blood sugar control, though he notes his A1c had improved previously. His recent blood pressure was 138/80 mmHg. He has not been taking hydrochlorothiazide  due to a refill issue but continues losartan . He generally follows a low-carb, healthy diet but admits to occasional French fries and peanut butter. He has gained about eight pounds and plans to monitor his diet more closely. He enjoys walking.  Assessment & Plan Type 2 diabetes mellitus with hyperglycemia and hyperlipidemia Blood glucose elevated due to lapse in glipizide . Blood pressure controlled. Hyperlipidemia managed with Crestor . - Ensured glipizide  10mg  ER prescription is filled and taken twice daily. - Checked A1c levels today, CMP and microalbumin urine. - Ensured metformin  1g bid prescription is filled and taken twice daily. - Provided AccuCheck strips and lancets for blood glucose monitoring. - Advised dietary modifications, including reducing intake of high-carbohydrate foods and opting for low-sugar peanut butter. - Increase exercise. - Scheduled follow-up appointment in 4 months.  Hypertension Taking Losartan  50mg  bid, HCTZ 25mg  qam (ran out recently), BP good today. - Refilled Losartan  & HCTZ for 90d supply with 1 refill - F/U in 4 mos or  prn  Acquired hypothyroidism Levothyroxine  adherence confirmed. - Recheck TSH today - Refill levothyroxine  today. - F/U in 1 year  Obesity Weight increased by eight pounds, dietary habits contribute to weight gain. - Advised on dietary modifications, including reducing intake of high-calorie foods and focusing on vegetables and protein. - Encouraged regular physical activity, such as walking.  Hyperlipidemia Cholesterol levels controlled with low-dose Crestor . - Sending refill for Crestor  5mg  qd - F/U in 6 mos for lab recheck  Subjective:    Outpatient Medications Prior to Visit  Medication Sig Dispense Refill   Accu-Chek Softclix Lancets lancets USE   TO CHECK GLUCOSE THREE TIMES DAILY AND AS NEEDED 100 each 0   aspirin 81 MG tablet Take 81 mg by mouth daily.       glipiZIDE  (GLIPIZIDE  XL) 10 MG 24 hr tablet Take 1 tablet (10 mg total) by mouth daily with breakfast. 180 tablet 1   glucose blood (ACCU-CHEK AVIVA PLUS) test strip E11.65 Check glucose TID and PRN 100 each 0   levothyroxine  (SYNTHROID ) 125 MCG tablet Take 1 tablet (125 mcg total) by mouth daily before breakfast. 90 tablet 0   losartan  (COZAAR ) 50 MG tablet Take 1 tablet by mouth twice daily 90 tablet 0   metFORMIN  (GLUCOPHAGE -XR) 500 MG 24 hr tablet TAKE 2 TABLETS BY MOUTH TWICE DAILY WITH MEALS 360 tablet 0   rosuvastatin  (CRESTOR ) 5 MG tablet Take 1 tablet (5 mg total) by mouth daily. 90 tablet 1   tadalafil (CIALIS) 20 MG tablet Take 20 mg by mouth daily as needed.     hydrochlorothiazide  (HYDRODIURIL ) 25 MG tablet Take 1 tablet (25  mg total) by mouth daily. (Patient not taking: Reported on 05/09/2024) 90 tablet 1   levothyroxine  (SYNTHROID ) 125 MCG tablet TAKE 1 TABLET BY MOUTH BEFORE BREAKFAST 90 tablet 0   No facility-administered medications prior to visit.   Past Medical History:  Diagnosis Date   Cancer [facility]) 2008   Prostate   Change in bowel habit 11/11/2021   Screening for malignant neoplasm of  colon 11/11/2021   Past Surgical History:  Procedure Laterality Date   PROSTATE SURGERY  2008   Allergies[1]    Objective:    Physical Exam Vitals and nursing note reviewed.  Constitutional:      General: He is not in acute distress.    Appearance: Normal appearance.  HENT:     Head: Normocephalic.  Cardiovascular:     Rate and Rhythm: Normal rate and regular rhythm.  Pulmonary:     Effort: Pulmonary effort is normal.     Breath sounds: Normal breath sounds.  Musculoskeletal:        General: Normal range of motion.     Cervical back: Normal range of motion.  Skin:    General: Skin is warm and dry.  Neurological:     Mental Status: He is alert and oriented to person, place, and time.  Psychiatric:        Mood and Affect: Mood normal.    BP 138/80 (BP Location: Left Arm, Patient Position: Sitting, Cuff Size: Large)   Pulse 89   Temp 98.1 F (36.7 C) (Temporal)   Ht 6' 2 (1.88 m)   Wt 241 lb 8 oz (109.5 kg)   SpO2 96%   BMI 31.01 kg/m  Wt Readings from Last 3 Encounters:  05/09/24 241 lb 8 oz (109.5 kg)  01/06/24 234 lb (106.1 kg)  10/19/23 234 lb (106.1 kg)      Jakyron Fabro, NP     [1] No Known Allergies

## 2024-05-10 LAB — HEMOGLOBIN A1C
Est. average glucose Bld gHb Est-mCnc: 289 mg/dL
Hgb A1c MFr Bld: 11.7 % — ABNORMAL HIGH (ref 4.8–5.6)

## 2024-05-11 ENCOUNTER — Ambulatory Visit: Payer: Self-pay | Admitting: Family

## 2024-05-11 DIAGNOSIS — E1165 Type 2 diabetes mellitus with hyperglycemia: Secondary | ICD-10-CM

## 2024-05-11 DIAGNOSIS — E1122 Type 2 diabetes mellitus with diabetic chronic kidney disease: Secondary | ICD-10-CM

## 2024-05-11 MED ORDER — EMPAGLIFLOZIN 10 MG PO TABS: 10.0000 mg | ORAL_TABLET | Freq: Every day | ORAL | 5 refills | Status: DC

## 2024-05-11 NOTE — Progress Notes (Signed)
 please call patient Friday with lab results below to be sure he understands.

## 2024-05-16 ENCOUNTER — Telehealth: Payer: Self-pay

## 2024-05-16 NOTE — Telephone Encounter (Signed)
 Copied from CRM #[id number]. Topic: Clinical - Prescription Issue >> May 16, 2024  2:00 PM Anairis L wrote: Reason for CRM: empagliflozin  (JARDIANCE ) 10 MG TABS tablet-costing patient $387 for a 30 day supply, please advise.

## 2024-05-19 ENCOUNTER — Other Ambulatory Visit: Payer: Self-pay | Admitting: Family

## 2024-05-19 NOTE — Telephone Encounter (Signed)
 ok, I understand. He will have to work hard to get his diabetes and HTN under better control so it does not continue to damage his kidneys. Reduce his carb intake including sweets, take meds as directed. Thx

## 2024-05-22 ENCOUNTER — Other Ambulatory Visit: Payer: Self-pay

## 2024-05-22 DIAGNOSIS — E1122 Type 2 diabetes mellitus with diabetic chronic kidney disease: Secondary | ICD-10-CM

## 2024-05-22 DIAGNOSIS — E1165 Type 2 diabetes mellitus with hyperglycemia: Secondary | ICD-10-CM

## 2024-05-22 MED ORDER — GLIPIZIDE ER 10 MG PO TB24: 10.0000 mg | ORAL_TABLET | Freq: Two times a day (BID) | ORAL | Status: DC

## 2024-05-22 MED ORDER — EMPAGLIFLOZIN 10 MG PO TABS: 10.0000 mg | ORAL_TABLET | Freq: Every day | ORAL | Status: AC

## 2024-05-22 NOTE — Telephone Encounter (Signed)
 I reached out to patient, patients states he has picked up Jardiance  yesterday and started medication.

## 2024-05-31 ENCOUNTER — Other Ambulatory Visit: Payer: Self-pay

## 2024-05-31 ENCOUNTER — Telehealth: Payer: Self-pay

## 2024-05-31 DIAGNOSIS — E1165 Type 2 diabetes mellitus with hyperglycemia: Secondary | ICD-10-CM

## 2024-05-31 MED ORDER — GLIPIZIDE ER 10 MG PO TB24: 10.0000 mg | ORAL_TABLET | Freq: Two times a day (BID) | ORAL | 0 refills | Status: AC

## 2024-05-31 NOTE — Telephone Encounter (Signed)
 Copied from CRM #[id number]. Topic: Clinical - Prescription Issue >> May 31, 2024 11:15 AM Tinnie BROCKS wrote: Reason for CRM: Pt calling regarding glipiZIDE  (GLIPIZIDE  XL) 10 MG 24 hr tablet. He is completely out of this medication. Pharmacy told him that due to the way it was sent in, insurance will not cover a refill until about 1/16. They said the instructions received show he is supposed to be taking one a day, but per mychart and a call he received from the office, he is supposed to be taking 2 a day. He has already missed todays dose due to being unable to get refill. #[id number]  Rx showing corrected in pt chart for correct dose and instructions and sent to pt pharmacy.

## 2024-06-05 LAB — OPHTHALMOLOGY REPORT-SCANNED

## 2024-06-12 NOTE — Progress Notes (Signed)
 Brian Marks                                          MRN: [id number]   06/12/2024   The VBCI Quality Team Specialist reviewed this patient medical record for the purposes of chart review for care gap closure. The following were reviewed: chart review for care gap closure-glycemic status assessment.  11.7 on 05/09/24    [facility] Quality Team

## 2024-09-08 ENCOUNTER — Ambulatory Visit: Admitting: Family

## 2025-01-08 ENCOUNTER — Ambulatory Visit
